# Patient Record
Sex: Male | Born: 1995 | Race: White | Hispanic: No | Marital: Single | Smoking: Never smoker
Health system: Southern US, Community
[De-identification: ages and names within clinical notes are randomized; demographics above are authoritative.]

## PROBLEM LIST (undated history)

## (undated) DIAGNOSIS — I471 Supraventricular tachycardia, unspecified: Secondary | ICD-10-CM

## (undated) DIAGNOSIS — Z8619 Personal history of other infectious and parasitic diseases: Secondary | ICD-10-CM

## (undated) DIAGNOSIS — I456 Pre-excitation syndrome: Secondary | ICD-10-CM

## (undated) DIAGNOSIS — Z9289 Personal history of other medical treatment: Secondary | ICD-10-CM

## (undated) DIAGNOSIS — I499 Cardiac arrhythmia, unspecified: Secondary | ICD-10-CM

## (undated) DIAGNOSIS — G43909 Migraine, unspecified, not intractable, without status migrainosus: Secondary | ICD-10-CM

## (undated) HISTORY — DX: Supraventricular tachycardia: I47.1

## (undated) HISTORY — PX: NO PAST SURGERIES: SHX2092

## (undated) HISTORY — DX: Supraventricular tachycardia, unspecified: I47.10

## (undated) HISTORY — DX: Migraine, unspecified, not intractable, without status migrainosus: G43.909

## (undated) HISTORY — DX: Pre-excitation syndrome: I45.6

## (undated) HISTORY — DX: Cardiac arrhythmia, unspecified: I49.9

## (undated) HISTORY — DX: Personal history of other infectious and parasitic diseases: Z86.19

---
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
## 2016-03-06 ENCOUNTER — Emergency Department (HOSPITAL_COMMUNITY): Payer: BLUE CROSS/BLUE SHIELD

## 2016-03-06 ENCOUNTER — Encounter (HOSPITAL_COMMUNITY): Payer: Self-pay | Admitting: Emergency Medicine

## 2016-03-06 ENCOUNTER — Emergency Department (HOSPITAL_COMMUNITY)
Admission: EM | Admit: 2016-03-06 | Discharge: 2016-03-06 | Disposition: A | Discharge status: Home / Self Care | Payer: BLUE CROSS/BLUE SHIELD | Attending: Emergency Medicine | Admitting: Emergency Medicine

## 2016-03-06 DIAGNOSIS — T675XXA Heat exhaustion, unspecified, initial encounter: Secondary | ICD-10-CM | POA: Diagnosis not present

## 2016-03-06 DIAGNOSIS — R42 Dizziness and giddiness: Secondary | ICD-10-CM | POA: Insufficient documentation

## 2016-03-06 DIAGNOSIS — R55 Syncope and collapse: Secondary | ICD-10-CM | POA: Diagnosis not present

## 2016-03-06 DIAGNOSIS — E860 Dehydration: Secondary | ICD-10-CM

## 2016-03-06 DIAGNOSIS — R110 Nausea: Secondary | ICD-10-CM

## 2016-03-06 DIAGNOSIS — I456 Pre-excitation syndrome: Secondary | ICD-10-CM

## 2016-03-06 DIAGNOSIS — K529 Noninfective gastroenteritis and colitis, unspecified: Secondary | ICD-10-CM

## 2016-03-06 DIAGNOSIS — R1084 Generalized abdominal pain: Secondary | ICD-10-CM | POA: Diagnosis present

## 2016-03-06 DIAGNOSIS — R197 Diarrhea, unspecified: Secondary | ICD-10-CM

## 2016-03-06 LAB — CBC WITH DIFFERENTIAL/PLATELET
BASOS ABS: 0 10*3/uL (ref 0.0–0.1)
BASOS PCT: 0 %
EOS ABS: 0 10*3/uL (ref 0.0–0.7)
EOS PCT: 1 %
HCT: 45.9 % (ref 39.0–52.0)
Hemoglobin: 15.2 g/dL (ref 13.0–17.0)
Lymphocytes Relative: 26 %
Lymphs Abs: 1.6 10*3/uL (ref 0.7–4.0)
MCH: 30.4 pg (ref 26.0–34.0)
MCHC: 33.1 g/dL (ref 30.0–36.0)
MCV: 91.8 fL (ref 78.0–100.0)
MONO ABS: 0.4 10*3/uL (ref 0.1–1.0)
Monocytes Relative: 6 %
NEUTROS ABS: 4.1 10*3/uL (ref 1.7–7.7)
Neutrophils Relative %: 67 %
Platelets: 231 10*3/uL (ref 150–400)
RBC: 5 MIL/uL (ref 4.22–5.81)
RDW: 12.9 % (ref 11.5–15.5)
WBC: 6.2 10*3/uL (ref 4.0–10.5)

## 2016-03-06 LAB — COMPREHENSIVE METABOLIC PANEL
ALBUMIN: 4.7 g/dL (ref 3.5–5.0)
ALK PHOS: 63 U/L (ref 38–126)
ALT: 17 U/L (ref 17–63)
ANION GAP: 6 (ref 5–15)
AST: 18 U/L (ref 15–41)
BILIRUBIN TOTAL: 0.9 mg/dL (ref 0.3–1.2)
BUN: 12 mg/dL (ref 6–20)
CALCIUM: 9.1 mg/dL (ref 8.9–10.3)
CO2: 27 mmol/L (ref 22–32)
Chloride: 104 mmol/L (ref 101–111)
Creatinine, Ser: 0.89 mg/dL (ref 0.61–1.24)
GFR calc non Af Amer: >60 mL/min (ref >60)
GLUCOSE: 95 mg/dL (ref 65–99)
Potassium: 4.1 mmol/L (ref 3.5–5.1)
Sodium: 137 mmol/L (ref 135–145)
TOTAL PROTEIN: 7.3 g/dL (ref 6.5–8.1)

## 2016-03-06 LAB — URINALYSIS, ROUTINE W REFLEX MICROSCOPIC
Bilirubin Urine: NEGATIVE
GLUCOSE, UA: NEGATIVE mg/dL
HGB URINE DIPSTICK: NEGATIVE
KETONES UR: NEGATIVE mg/dL
LEUKOCYTES UA: NEGATIVE
Nitrite: NEGATIVE
PROTEIN: NEGATIVE mg/dL
Specific Gravity, Urine: 1.009 (ref 1.005–1.030)
pH: 7.5 (ref 5.0–8.0)

## 2016-03-06 LAB — CK: Total CK: 132 U/L (ref 49–397)

## 2016-03-06 LAB — I-STAT TROPONIN, ED: Troponin i, poc: 0 ng/mL (ref 0.00–0.08)

## 2016-03-06 LAB — LIPASE, BLOOD: Lipase: 24 U/L (ref 11–51)

## 2016-03-06 LAB — TSH: TSH: 1.041 u[IU]/mL (ref 0.350–4.500)

## 2016-03-06 MED ORDER — SODIUM CHLORIDE 0.9 % IV BOLUS (SEPSIS)
1000.0000 mL | Freq: Once | INTRAVENOUS | Status: AC
  Administered 2016-03-06: 1000 mL via INTRAVENOUS

## 2016-03-06 MED ORDER — ONDANSETRON 4 MG PO TBDP
4.0000 mg | ORAL_TABLET | Freq: Three times a day (TID) | ORAL | Status: DC | PRN
Start: 2016-03-06 — End: 2016-12-02

## 2016-03-06 NOTE — ED Notes (Signed)
PA at bedside.

## 2016-03-06 NOTE — ED Notes (Signed)
Pt given discharge instructions verbalized understanding of need to follow up with PCP and cardiologist, reasons to return to the ED and medications to take at home. Pt denied pain, VSS, IV removed intact, site clean and dry. Pt denied further questions or concerns. Pt able to change clothes and ambulate to exit without difficulty.

## 2016-03-06 NOTE — ED Notes (Signed)
Pt made aware of need for urine sample, urinal and call light at bedside. 

## 2016-03-06 NOTE — ED Notes (Signed)
Per patient, he became weak on Thursday night with abdominal pain associated with headaches.  Patient states he works outside.  He states he has nausea and diarrhea.  Denies any fever and chilis.

## 2016-03-06 NOTE — ED Notes (Signed)
Pt transported to xray 

## 2016-03-06 NOTE — Discharge Instructions (Signed)
Stay very well hydrated with plenty of water/gatorade. Avoid being outside for prolonged periods of time during the peak heat throughout the day, and always make sure you're hydrated when outside. Get plenty of rest. Use zofran as prescribed, as needed for nausea. Use tylenol or motrin as needed for pain. Follow a BRAT (banana-rice-applesauce-toast) diet as described below for the next 24-48 hours. The 'BRAT' diet is suggested, then progress to diet as tolerated as symptoms abate. Call your regular doctor if bloody stools, persistent diarrhea, vomiting, fever or abdominal pain.  Follow up with your primary care doctor and your cardiologist in 5-7 days for recheck of symptoms and ongoing management of your conditions. Return to ER for changing or worsening of symptoms.  Food Choices to Help Relieve Diarrhea When you have diarrhea, the foods you eat and your eating habits are very important. Choosing the right foods and drinks can help relieve diarrhea. Also, because diarrhea can last up to 7 days, you need to replace lost fluids and electrolytes (such as sodium, potassium, and chloride) in order to help prevent dehydration.  WHAT GENERAL GUIDELINES DO I NEED TO FOLLOW?  Slowly drink 1 cup (8 oz) of fluid for each episode of diarrhea. If you are getting enough fluid, your urine will be clear or pale yellow.  Eat starchy foods. Some good choices include white rice, white toast, pasta, low-fiber cereal, baked potatoes (without the skin), saltine crackers, and bagels.  Avoid large servings of any cooked vegetables.  Limit fruit to two servings per day. A serving is  cup or 1 small piece.  Choose foods with less than 2 g of fiber per serving.  Limit fats to less than 8 tsp (38 g) per day.  Avoid fried foods.  Eat foods that have probiotics in them. Probiotics can be found in certain dairy products.  Avoid foods and beverages that may increase the speed at which food moves through the stomach and  intestines (gastrointestinal tract). Things to avoid include:  High-fiber foods, such as dried fruit, raw fruits and vegetables, nuts, seeds, and whole grain foods.  Spicy foods and high-fat foods.  Foods and beverages sweetened with high-fructose corn syrup, honey, or sugar alcohols such as xylitol, sorbitol, and mannitol. WHAT FOODS ARE RECOMMENDED? Grains White rice. White, [place], or pita breads (fresh or toasted), including plain rolls, buns, or bagels. White pasta. Saltine, soda, or graham crackers. Pretzels. Low-fiber cereal. Cooked cereals made with water (such as cornmeal, farina, or cream cereals). Plain muffins. Matzo. Melba toast. Zwieback.  Vegetables Potatoes (without the skin). Strained tomato and vegetable juices. Most well-cooked and canned vegetables without seeds. Tender lettuce. Fruits Cooked or canned applesauce, apricots, cherries, fruit cocktail, grapefruit, peaches, pears, or plums. Fresh bananas, apples without skin, cherries, grapes, cantaloupe, grapefruit, peaches, oranges, or plums.  Meat and Other Protein Products Baked or boiled chicken. Eggs. Tofu. Fish. Seafood. Smooth peanut butter. Ground or well-cooked tender beef, ham, veal, lamb, pork, or poultry.  Dairy Plain yogurt, kefir, and unsweetened liquid yogurt. Lactose-free milk, buttermilk, or soy milk. Plain hard cheese. Beverages Sport drinks. Clear broths. Diluted fruit juices (except prune). Regular, caffeine-free sodas such as ginger ale. Water. Decaffeinated teas. Oral rehydration solutions. Sugar-free beverages not sweetened with sugar alcohols. Other Bouillon, broth, or soups made from recommended foods.  The items listed above may not be a complete list of recommended foods or beverages. Contact your dietitian for more options. WHAT FOODS ARE NOT RECOMMENDED? Grains Whole grain, whole wheat, bran, or rye  breads, rolls, pastas, crackers, and cereals. Wild or brown rice. Cereals that contain more than 2  g of fiber per serving. Corn tortillas or taco shells. Cooked or dry oatmeal. Granola. Popcorn. Vegetables Raw vegetables. Cabbage, broccoli, Brussels sprouts, artichokes, baked beans, beet greens, corn, kale, legumes, peas, sweet potatoes, and yams. Potato skins. Cooked spinach and cabbage. Fruits Dried fruit, including raisins and dates. Raw fruits. Stewed or dried prunes. Fresh apples with skin, apricots, mangoes, pears, raspberries, and strawberries.  Meat and Other Protein Products Chunky peanut butter. Nuts and seeds. Beans and lentils. Tomasa Blase.  Dairy High-fat cheeses. Milk, chocolate milk, and beverages made with milk, such as milk shakes. Cream. Ice cream. Sweets and Desserts Sweet rolls, doughnuts, and sweet breads. Pancakes and waffles. Fats and Oils Butter. Cream sauces. Margarine. Salad oils. Plain salad dressings. Olives. Avocados.  Beverages Caffeinated beverages (such as coffee, tea, soda, or energy drinks). Alcoholic beverages. Fruit juices with pulp. Prune juice. Soft drinks sweetened with high-fructose corn syrup or sugar alcohols. Other Coconut. Hot sauce. Chili powder. Mayonnaise. Gravy. Cream-based or milk-based soups.  The items listed above may not be a complete list of foods and beverages to avoid. Contact your dietitian for more information. WHAT SHOULD I DO IF I BECOME DEHYDRATED? Diarrhea can sometimes lead to dehydration. Signs of dehydration include dark urine and dry mouth and skin. If you think you are dehydrated, you should rehydrate with an oral rehydration solution. These solutions can be purchased at pharmacies, retail stores, or online.  Drink -1 cup (120-240 mL) of oral rehydration solution each time you have an episode of diarrhea. If drinking this amount makes your diarrhea worse, try drinking smaller amounts more often. For example, drink 1-3 tsp (5-15 mL) every 5-10 minutes.  A general rule for staying hydrated is to drink 1-2 L of fluid per day. Talk to  your health care provider about the specific amount you should be drinking each day. Drink enough fluids to keep your urine clear or pale yellow. Document Released: 10/23/2003 Document Revised: 08/07/2013 Document Reviewed: 06/25/2013 [facility] Patient Information 2015 [place], [place]. This information is not intended to replace advice given to you by your health care provider. Make sure you discuss any questions you have with your health care provider.   Abdominal Pain, Adult Many things can cause belly (abdominal) pain. Most times, the belly pain is not dangerous. Many cases of belly pain can be watched and treated at home. HOME CARE   Do not take medicines that help you go poop (laxatives) unless told to by your doctor.  Only take medicine as told by your doctor.  Eat or drink as told by your doctor. Your doctor will tell you if you should be on a special diet. GET HELP IF:  You do not know what is causing your belly pain.  You have belly pain while you are sick to your stomach (nauseous) or have runny poop (diarrhea).  You have pain while you pee or poop.  Your belly pain wakes you up at night.  You have belly pain that gets worse or better when you eat.  You have belly pain that gets worse when you eat fatty foods.  You have a fever. GET HELP RIGHT AWAY IF:   The pain does not go away within 2 hours.  You keep throwing up (vomiting).  The pain changes and is only in the right or left part of the belly.  You have bloody or tarry looking poop. MAKE SURE  YOU:   Understand these instructions.  Will watch your condition.  Will get help right away if you are not doing well or get worse.   This information is not intended to replace advice given to you by your health care provider. Make sure you discuss any questions you have with your health care provider.   Document Released: 01/19/2008 Document Revised: 08/23/2014 Document Reviewed: 04/11/2013 Elsevier Interactive  Patient Education 2016 Else[employer] Inc.  Dehydration, Adult Dehydration is a condition in which you do not have enough fluid or water in your body. It happens when you take in less fluid than you lose. Vital organs such as the kidneys, brain, and heart cannot function without a proper amount of fluids. Any loss of fluids from the body can cause dehydration.  Dehydration can range from mild to severe. This condition should be treated right away to help prevent it from becoming severe. CAUSES  This condition may be caused by:  Vomiting.  Diarrhea.  Excessive sweating, such as when exercising in hot or humid weather.  Not drinking enough fluid during strenuous exercise or during an illness.  Excessive urine output.  Fever.  Certain medicines. RISK FACTORS This condition is more likely to develop in:  People who are taking certain medicines that cause the body to lose excess fluid (diuretics).   People who have a chronic illness, such as diabetes, that may increase urination.  Older adults.   People who live at high altitudes.   People who participate in endurance sports.  SYMPTOMS  Mild Dehydration  Thirst.  Dry lips.  Slightly dry mouth.  Dry, warm skin. Moderate Dehydration  Very dry mouth.   Muscle cramps.   Dark urine and decreased urine production.   Decreased tear production.   Headache.   Light-headedness, especially when you stand up from a sitting position.  Severe Dehydration  Changes in skin.   Cold and clammy skin.   Skin does not spring back quickly when lightly pinched and released.   Changes in body fluids.   Extreme thirst.   No tears.   Not able to sweat when body temperature is high, such as in hot weather.   Minimal urine production.   Changes in vital signs.   Rapid, weak pulse (more than 100 beats per minute when you are sitting still).   Rapid breathing.   Low blood pressure.   Other changes.    Sunken eyes.   Cold hands and feet.   Confusion.  Lethargy and difficulty being awakened.  Fainting (syncope).   Short-term weight loss.   Unconsciousness. DIAGNOSIS  This condition may be diagnosed based on your symptoms. You may also have tests to determine how severe your dehydration is. These tests may include:   Urine tests.   Blood tests.  TREATMENT  Treatment for this condition depends on the severity. Mild or moderate dehydration can often be treated at home. Treatment should be started right away. Do not wait until dehydration becomes severe. Severe dehydration needs to be treated at the hospital. Treatment for Mild Dehydration  Drinking plenty of water to replace the fluid you have lost.   Replacing minerals in your blood (electrolytes) that you may have lost.  Treatment for Moderate Dehydration  Consuming oral rehydration solution (ORS). Treatment for Severe Dehydration  Receiving fluid through an IV tube.   Receiving electrolyte solution through a feeding tube that is passed through your nose and into your stomach (nasogastric tube or NG tube).  Correcting any  abnormalities in electrolytes. HOME CARE INSTRUCTIONS   Drink enough fluid to keep your urine clear or pale yellow.   Drink water or fluid slowly by taking small sips. You can also try sucking on ice cubes.  Have food or beverages that contain electrolytes. Examples include bananas and sports drinks.  Take over-the-counter and prescription medicines only as told by your health care provider.   Prepare ORS according to the manufacturer's instructions. Take sips of ORS every 5 minutes until your urine returns to normal.  If you have vomiting or diarrhea, continue to try to drink water, ORS, or both.   If you have diarrhea, avoid:   Beverages that contain caffeine.   Fruit juice.   Milk.   Carbonated soft drinks.  Do not take salt tablets. This can lead to the  condition of having too much sodium in your body (hypernatremia).  SEEK MEDICAL CARE IF:  You cannot eat or drink without vomiting.  You have had moderate diarrhea during a period of more than 24 hours.  You have a fever. SEEK IMMEDIATE MEDICAL CARE IF:   You have extreme thirst.  You have severe diarrhea.  You have not urinated in 6-8 hours, or you have urinated only a small amount of very dark urine.  You have shriveled skin.  You are dizzy, confused, or both.   This information is not intended to replace advice given to you by your health care provider. Make sure you discuss any questions you have with your health care provider.   Document Released: 08/02/2005 Document Revised: 04/23/2015 Document Reviewed: 12/18/2014 Elsevier Interactive Patient Education 2016 Elsevier Inc.  Diarrhea Diarrhea is watery poop (stool). It can make you feel weak, tired, thirsty, or give you a dry mouth (signs of dehydration). Watery poop is a sign of another problem, most often an infection. It often lasts 2-3 days. It can last longer if it is a sign of something serious. Take care of yourself as told by your doctor. HOME CARE   Drink 1 cup (8 ounces) of fluid each time you have watery poop.  Do not drink the following fluids:  Those that contain simple sugars (fructose, glucose, galactose, lactose, sucrose, maltose).  Sports drinks.  Fruit juices.  Whole milk products.  Sodas.  Drinks with caffeine (coffee, tea, soda) or alcohol.  Oral rehydration solution may be used if the doctor says it is okay. You may make your own solution. Follow this recipe:   - teaspoon table salt.   teaspoon baking soda.   teaspoon salt substitute containing potassium chloride.  1 tablespoons sugar.  1 liter (34 ounces) of water.  Avoid the following foods:  High fiber foods, such as raw fruits and vegetables.  Nuts, seeds, and whole grain breads and cereals.   Those that are sweetened with  sugar alcohols (xylitol, sorbitol, mannitol).  Try eating the following foods:  Starchy foods, such as rice, toast, pasta, low-sugar cereal, oatmeal, baked potatoes, crackers, and bagels.  Bananas.  Applesauce.  Eat probiotic-rich foods, such as yogurt and milk products that are fermented.  Wash your hands well after each time you have watery poop.  Only take medicine as told by your doctor.  Take a warm bath to help lessen burning or pain from having watery poop. GET HELP RIGHT AWAY IF:   You cannot drink fluids without throwing up (vomiting).  You keep throwing up.  You have blood in your poop, or your poop looks black and tarry.  You  do not pee (urinate) in 6-8 hours, or there is only a small amount of very dark pee.  You have belly (abdominal) pain that gets worse or stays in the same spot (localizes).  You are weak, dizzy, confused, or light-headed.  You have a very bad headache.  Your watery poop gets worse or does not get better.  You have a fever or lasting symptoms for more than 2-3 days.  You have a fever and your symptoms suddenly get worse. MAKE SURE YOU:   Understand these instructions.  Will watch your condition.  Will get help right away if you are not doing well or get worse.   This information is not intended to replace advice given to you by your health care provider. Make sure you discuss any questions you have with your health care provider.   Document Released: 01/19/2008 Document Revised: 08/23/2014 Document Reviewed: 04/09/2012 Elsevier Interactive Patient Education 2016 [employer].  [employer] Exhaustion Information WHAT IS HEAT EXHAUSTION? Heat exhaustion happens when your body gets overheated from hot weather or from exercise. Heat exhaustion makes the temperature of your skin and body go up. Your body cools itself by sweating. If you do not drink enough water to replace what you sweat, you lose too much water and salt. This makes it harder  for your body to produce more sweat. When you do not sweat enough, your body cannot cool down, and heat exhaustion may result. Heat exhaustion can lead to heatstroke, which is a more serious illness. WHO IS AT RISK FOR HEAT EXHAUSTION? Anyone can get heat exhaustion. However, heat exhaustion is more likely when you are exercising or doing a physical activity. It is also more likely when you are in hot and humid weather or bright sunshine. Heat exhaustion is also more likely to develop in:  People who are age 63 or older.  Children.  People who have a medical condition such as heart disease, poor circulation, sickle cell disease, or high blood pressure.  People who have a fever.  People who are very overweight (obese).  People who are dehydrated from:  Drinking alcohol or caffeine.  Taking certain medicines, such as diuretics or stimulants. WHAT ARE THE SYMPTOMS OF HEAT EXHAUSTION? Symptoms of heat exhaustion include:  A body temperature of up to 104F (40C).  Moist, cool, and clammy skin.  Dizziness.  Headache.  Nausea.  Fatigue.  Thirst.  Dark-colored urine.  Rapid pulse or heartbeat.  Weakness.  Muscle cramps.  Confusion.  Fainting. WHAT SHOULD I DO IF I THINK I HAVE HEAT EXHAUSTION? If you think that you have heat exhaustion, call your health care provider. Follow his or her instructions. You should also:  Call a friend or a family member and ask someone to stay with you.  Move to a cooler location, such as:  Into the shade.  In front of a fan.  Someplace that has air conditioning.  Lie down and rest.  Slowly drink nonalcoholic, caffeine-free fluids.  Take off any extra clothing or tight-fitting clothes.  Take a cool bath or shower, if possible. If you do not have access to a bath or shower, dab or mist cool water on your skin. WHY IS IT IMPORTANT TO TREAT HEAT EXHAUSTION? It is extremely important to take care of yourself and treat heat  exhaustion as soon as possible. Untreated heat exhaustion can turn into heatstroke. Symptoms of heatstroke include:  A body temperature of 104F (40C) or higher.  Hot, red skin that may  be dry or moist.  Severe headache.  Nausea and vomiting.  Muscle weakness and cramping.  Confusion.  Rapid breathing.  Fainting.  Seizure. These symptoms may represent a serious problem that is an emergency. Do not wait to see if the symptoms will go away. Get medical help right away. Call your local emergency services (911 in the [place] Do not drive yourself to the hospital. Heatstroke is a life-threatening condition that requires urgent medical treatment. Do not treat heatstroke at home. Heatstroke should be treated by a health care professional and may require hospitalization. At the hospital, you may need to receive fluids through an IV tube:  If you cannot drink any fluids.  If you vomit any fluids that you drink.  If your symptoms do not get better after one hour.  If your symptoms get worse after one hour. HOW CAN I PREVENT HEAT EXHAUSTION?  Avoid outdoor activities on very hot or humid days.  Do not exercise or do other physical activity when you are not feeling well.  Drink plenty of nonalcoholic and caffeine-free fluids before and during physical activity.  Take frequent breaks for rest during physical activity.  Wear light-colored, loose-fitting, and lightweight clothing in the heat.  Wear a hat and use sunscreen when exercising outdoors.  Avoid being outside during the hottest times of the day.  Check with your health care provider before you start any new activity, especially if you take medicine or have a medical condition.  Start any new activity slowly and work up to your fitness level. HOW CAN I HELP TO PROTECT ELDERLY RELATIVES AND NEIGHBORS FROM HEAT EXHAUSTION? People who are age 43 or older are at greater risk for heat exhaustion. Their bodies have a harder time  adjusting to heat. They are also more likely to have a medical condition or be on medicines that increase their risk for heat exhaustion. They may get heat exhaustion indoors if the heat is high for several days. You can help to protect them during hot weather by:  Checking on them two or more times each day.  Making sure that they are drinking plenty of cool, nonalcoholic, and caffeine-free fluids.  Making sure that they use their air conditioner.  Taking them to a location where air conditioning is available.  Talking with their health care provider about their medical needs, medicines, and fluid requirements.   This information is not intended to replace advice given to you by your health care provider. Make sure you discuss any questions you have with your health care provider.   Document Released: 05/11/2008 Document Revised: 04/23/2015 Document Reviewed: 07/10/2014 Elsevier Interactive Patient Education 2016 Elsevier Inc.  Nausea, Adult Nausea is the feeling that you have an upset stomach or have to vomit. Nausea by itself is not likely a serious concern, but it may be an early sign of more serious medical problems. As nausea gets worse, it can lead to vomiting. If vomiting develops, there is the risk of dehydration.  CAUSES   Viral infections.  Food poisoning.  Medicines.  Pregnancy.  Motion sickness.  Migraine headaches.  Emotional distress.  Severe pain from any source.  Alcohol intoxication. HOME CARE INSTRUCTIONS  Get plenty of rest.  Ask your caregiver about specific rehydration instructions.  Eat small amounts of food and sip liquids more often.  Take all medicines as told by your caregiver. SEEK MEDICAL CARE IF:  You have not improved after 2 days, or you get worse.  You have a headache.  SEEK IMMEDIATE MEDICAL CARE IF:   You have a fever.  You faint.  You keep vomiting or have blood in your vomit.  You are extremely weak or dehydrated.  You  have dark or bloody stools.  You have severe chest or abdominal pain. MAKE SURE YOU:  Understand these instructions.  Will watch your condition.  Will get help right away if you are not doing well or get worse.   This information is not intended to replace advice given to you by your health care provider. Make sure you discuss any questions you have with your health care provider.   Document Released: 09/09/2004 Document Revised: 08/23/2014 Document Reviewed: 04/14/2011 Elsevier Interactive Patient Education 2016 [employer].  Near-Syncope Near-syncope (commonly known as near fainting) is sudden weakness, dizziness, or feeling like you might pass out. During an episode of near-syncope, you may also develop pale skin, have tunnel vision, or feel sick to your stomach (nauseous). Near-syncope may occur when getting up after sitting or while standing for a long time. It is caused by a sudden decrease in blood flow to the brain. This decrease can result from various causes or triggers, most of which are not serious. However, because near-syncope can sometimes be a sign of something serious, a medical evaluation is required. The specific cause is often not determined. HOME CARE INSTRUCTIONS  Monitor your condition for any changes. The following actions may help to alleviate any discomfort you are experiencing:  Have someone stay with you until you feel stable.  Lie down right away and prop your feet up if you start feeling like you might faint. Breathe deeply and steadily. Wait until all the symptoms have passed. Most of these episodes last only a few minutes. You may feel tired for several hours.   Drink enough fluids to keep your urine clear or pale yellow.   If you are taking blood pressure or heart medicine, get up slowly when seated or lying down. Take several minutes to sit and then stand. This can reduce dizziness.  Follow up with your health care provider as directed. SEEK  IMMEDIATE MEDICAL CARE IF:   You have a severe headache.   You have unusual pain in the chest, abdomen, or back.   You are bleeding from the mouth or rectum, or you have black or tarry stool.   You have an irregular or very fast heartbeat.   You have repeated fainting or have seizure-like jerking during an episode.   You faint when sitting or lying down.   You have confusion.   You have difficulty walking.   You have severe weakness.   You have vision problems.  MAKE SURE YOU:   Understand these instructions.  Will watch your condition.  Will get help right away if you are not doing well or get worse.   This information is not intended to replace advice given to you by your health care provider. Make sure you discuss any questions you have with your health care provider.   Document Released: 08/02/2005 Document Revised: 08/07/2013 Document Reviewed: 01/05/2013 Elsevier Interactive Patient Education 2016 Elsevier Inc.  Rehydration, Adult Rehydration is the replacement of body fluids lost during dehydration. Dehydration is an extreme loss of body fluids to the point of body function impairment. There are many ways extreme fluid loss can occur, including vomiting, diarrhea, or excess sweating. Recovering from dehydration requires replacing lost fluids, continuing to eat to maintain strength, and avoiding foods and beverages that may contribute to  further fluid loss or may increase nausea. HOW TO REHYDRATE In most cases, rehydration involves the replacement of not only fluids but also carbohydrates and basic body salts. Rehydration with an oral rehydration solution is one way to replace essential nutrients lost through dehydration. An oral rehydration solution can be purchased at pharmacies, retail stores, and online. Premixed packets of powder that you combine with water to make a solution are also sold. You can prepare an oral rehydration solution at home by mixing the  following ingredients together:    - tsp table salt.   tsp baking soda.   tsp salt substitute containing potassium chloride.  1 tablespoons sugar.  1 L (34 oz) of water. Be sure to use exact measurements. Including too much sugar can make diarrhea worse. Drink -1 cup (120-240 mL) of oral rehydration solution each time you have diarrhea or vomit. If drinking this amount makes your vomiting worse, try drinking smaller amounts more often. For example, drink 1-3 tsp every 5-10 minutes.  A general rule for staying hydrated is to drink 1-2 L of fluid per day. Talk to your caregiver about the specific amount you should be drinking each day. Drink enough fluids to keep your urine clear or pale yellow. EATING WHEN DEHYDRATED Even if you have had severe sweating or you are having diarrhea, do not stop eating. Many healthy items in a normal diet are okay to continue eating while recovering from dehydration. The following tips can help you to lessen nausea when you eat:  Ask someone else to prepare your food. Cooking smells may worsen nausea.  Eat in a well-ventilated room away from cooking smells.  Sit up when you eat. Avoid lying down until 1-2 hours after eating.  Eat small amounts when you eat.  Eat foods that are easy to digest. These include soft, well-cooked, or mashed foods. FOODS AND BEVERAGES TO AVOID Avoid eating or drinking the following foods and beverages that may increase nausea or further loss of fluid:   Fruit juices with a high sugar content, such as concentrated juices.  Alcohol.  Beverages containing caffeine.  Carbonated drinks. They may cause a lot of gas.  Foods that may cause a lot of gas, such as cabbage, broccoli, and beans.  Fatty, greasy, and fried foods.  Spicy, very salty, and very sweet foods or drinks.  Foods or drinks that are very hot or very cold. Consume food or drinks at or near room temperature.  Foods that need a lot of chewing, such as raw  vegetables.  Foods that are sticky or hard to swallow, such as peanut butter.   This information is not intended to replace advice given to you by your health care provider. Make sure you discuss any questions you have with your health care provider.   Document Released: 10/25/2011 Document Revised: 04/26/2012 Document Reviewed: 10/25/2011 Elsevier Interactive Patient Education 2016 [employer].  Wolff-Parkinson-White Syndrome Wolff-Parkinson-White Syndrome (WPW) is an abnormal heart condition that can cause the heart to beat very fast. CAUSES  In WPW syndrome, an extra electrical connection (pathway) exists between the top chambers of your heart (atria) and the bottom chambers of your heart (ventricles). This is known as an extra (accessory) pathway. This extra pathway can cause the heart to short circuit and beat very fast. SYMPTOMS  Symptoms in WPW syndrome can vary. These include:  Feeling your heart "skip" beats (palpitations).  Dizziness.  Fainting or near fainting.  Sudden death. DIAGNOSIS  WPW can be diagnosed by  different test such as:  ECG (electrocardiogram).  Echocardiogram.  Holter monitoring.  Stress testing.  Electrophysiology study.  Laboratory tests that check certain blood levels.  Thyroid study. TREATMENT  WPW is usually treated in two ways:  Radiofrequency destruction (ablation). In this procedure, a thin, flexible tube (catheter) is placed in the heart through a vein in the upper leg (groin). The catheter is guided to the extra pathway. The extra pathway is destroyed by using high frequency radio waves.  Medicine can sometimes be used to treat WPW. SEEK IMMEDIATE MEDICAL CARE IF:   You feel palpitations that are frequent or continual.  You develop chest pain and also have:  Shortness of breath or difficulty breathing.  Nausea and vomiting.  Sweating.  You become light-headed or faint (pass out). MAKE SURE YOU:   Understand these  instructions.  Will watch your condition.  Will get help right away if you are not doing well or get worse.   This information is not intended to replace advice given to you by your health care provider. Make sure you discuss any questions you have with your health care provider.   Document Released: 10/23/2003 Document Revised: 10/25/2011 Document Reviewed: 02/19/2015 Elsevier Interactive Patient Education [employer]

## 2016-03-06 NOTE — ED Notes (Signed)
PA at bedside unable to collect labs at this time

## 2016-03-06 NOTE — ED Provider Notes (Signed)
CSN: [id number]     Arrival date & time 03/06/16  1251 History   First MD Initiated Contact with Patient 03/06/16 1319     Chief Complaint  Patient presents with  . Abdominal Pain  . Diarrhea     (Consider location/radiation/quality/duration/timing/severity/associated sxs/prior Treatment) HPI Comments: Paul Bean is a 20 y.o. male with a PMHx of WPW and PSVT (followed by Dr. Ladona Ridgel of [facility]), who presents to the ED with multiple complaints. Her primary complaint is 2 days of feeling lightheadedness when he exerts himself or stands up, mostly describes it as a near syncopal feeling but occasionally has some vertigo component describing a spinning sensation. This resolves completely when he rests, and he felt better yesterday because he was resting most of the day, but every time he gets up he has recurrence of symptoms. 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 His mother is with him and is worried that either his thyroid is off, or he has heat exhaustion, or that his WPW is causing issues (and she is unhappy that the pt won't agree to getting the ablation that he was recommended to get). Additionally he reports that yesterday he developed 5/10 aching nonradiating generalized abdominal pain which is intermittent worse with eating and improved with Tylenol, associated with nausea and 2 episodes of nonbloody watery diarrhea. He also reports he has had intermittent headaches which feel similar to prior headaches and is currently resolved; his mother thinks he has migraines because she has migraines, but also wonders if the pt could just be dehydrated and getting headaches from that. He states that sometimes he gets short of breath when he exerts himself, mostly when he feels lightheaded, but this is not a persistent symptom and he has no ongoing shortness of breath at this time. +Sick contacts at work with similar GI symptoms.   He denies any  diaphoresis, syncope, vision changes, fevers, chills, chest pain, ongoing shortness of breath, leg swelling, recent travel/surgery/immobilization, family or personal history of DVT/PE, vomiting, hematochezia, melena, constipation, obstipation, dysuria, hematuria, claudication, orthopnea, numbness, tingling, or focal weakness. He denies suspicious food intake, alcohol use, or chronic NSAID use. No prior abdominal surgeries.  Patient is a 20 y.o. male presenting with abdominal pain, diarrhea, and near-syncope. The history is provided by the patient and medical records. No language interpreter was used.  Abdominal Pain Pain location:  Generalized Pain quality: aching   Pain radiates to:  Does not radiate Pain severity:  Mild Onset quality:  Gradual Duration:  1 day Timing:  Intermittent Progression:  Unchanged Chronicity:  New Context: sick contacts   Context: not recent travel and not suspicious food intake   Relieved by:  Acetaminophen Worsened by:  Eating Ineffective treatments:  None tried Associated symptoms: diarrhea and nausea   Associated symptoms: no chest pain, no chills, no constipation, no dysuria, no fever, no flatus, no hematemesis, no hematochezia, no hematuria, no melena, no shortness of breath (occasionally with exertion, none ongoing) and no vomiting   Risk factors: no alcohol abuse, has not had multiple surgeries and no NSAID use   Diarrhea Associated symptoms: abdominal pain and headaches (intermittent, currently resolved)   Associated symptoms: no arthralgias, no chills, no diaphoresis, no fever, no myalgias and no vomiting   Near Syncope This is a recurrent problem. The current episode started in the past 7 days. The problem occurs daily. The problem has been unchanged. Associated symptoms include  abdominal pain, headaches (intermittent, currently resolved), nausea and vertigo (occasionally describes spinning sensation, occasionally describes just lightheadedness).  Pertinent negatives include no arthralgias, chest pain, chills, diaphoresis, fever, myalgias, numbness, visual change, vomiting or weakness. The symptoms are aggravated by standing and exertion. He has tried rest for the symptoms. The treatment provided significant relief.    Past Medical History  Diagnosis Date  . Arrhythmia   . PSVT (paroxysmal supraventricular tachycardia) (HCC)   . WPW (Wolff-Parkinson-White syndrome)    History reviewed. No pertinent past surgical history. No family history on file. Social History  Substance Use Topics  . Smoking status: Never Smoker   . Smokeless tobacco: None  . Alcohol Use: No    Review of Systems  Constitutional: Negative for fever, chills and diaphoresis.  Eyes: Negative for visual disturbance.  Respiratory: Negative for shortness of breath (occasionally with exertion, none ongoing).   Cardiovascular: Positive for near-syncope. Negative for chest pain and leg swelling.  Gastrointestinal: Positive for nausea, abdominal pain and diarrhea. Negative for vomiting, constipation, blood in stool, melena, hematochezia, flatus and hematemesis.  Genitourinary: Negative for dysuria and hematuria.  Musculoskeletal: Negative for myalgias and arthralgias.  Skin: Negative for color change.  Allergic/Immunologic: Negative for immunocompromised state.  Neurological: Positive for vertigo (occasionally describes spinning sensation, occasionally describes just lightheadedness), light-headedness and headaches (intermittent, currently resolved). Negative for weakness and numbness.  Psychiatric/Behavioral: Negative for confusion.   10 Systems reviewed and are negative for acute change except as noted in the HPI.    Allergies  Penicillins  Home Medications   Prior to Admission medications   Not on File   BP 122/79 mmHg  Pulse 75  Temp(Src) 98.1 F (36.7 C) (Oral)  Resp 18  Ht 6\' 2"  (1.88 m)  Wt 70.308 kg  BMI 19.89 kg/m2  SpO2 100% Physical Exam    Constitutional: He is oriented to person, place, and time. Vital signs are normal. He appears well-developed and well-nourished.  Non-toxic appearance. No distress.  Afebrile, nontoxic, NAD  HENT:  Head: Normocephalic and atraumatic.  Mouth/Throat: Oropharynx is clear and moist and mucous membranes are normal.  Eyes: Conjunctivae and EOM are normal. Pupils are equal, round, and reactive to light. Right eye exhibits no discharge. Left eye exhibits no discharge.  PERRL, EOMI, no nystagmus, no visual field deficits   Neck: Normal range of motion. Neck supple. No spinous process tenderness and no muscular tenderness present. No rigidity. Normal range of motion present.  FROM intact without spinous process TTP, no bony stepoffs or deformities, no paraspinous muscle TTP or muscle spasms. No rigidity or meningeal signs. No bruising or swelling.   Cardiovascular: Normal rate, regular rhythm, normal heart sounds and intact distal pulses.  Exam reveals no gallop and no friction rub.   No murmur heard. RRR, nl s1/s2, no m/r/g, distal pulses intact, no pedal edema   Pulmonary/Chest: Effort normal and breath sounds normal. No respiratory distress. He has no decreased breath sounds. He has no wheezes. He has no rhonchi. He has no rales.  CTAB in all lung fields, no w/r/r, no hypoxia or increased WOB, speaking in full sentences, SpO2 100% on RA   Abdominal: Soft. Normal appearance and bowel sounds are normal. He exhibits no distension. There is tenderness in the suprapubic area and left lower quadrant. There is no rigidity, no rebound, no guarding, no CVA tenderness, no tenderness at McBurney's point and negative Murphy's sign.    Soft, nondistended, +BS throughout, with very mild discomfort/soreness on palpation of  the lower abdomen, no r/g/r, neg murphy's, neg mcburney's, no CVA TTP   Musculoskeletal: Normal range of motion.  MAE x4 Strength and sensation grossly intact Distal pulses intact Gait steady   Neurological: He is alert and oriented to person, place, and time. He has normal strength. No cranial nerve deficit or sensory deficit. He displays a negative Romberg sign. Coordination and gait normal. GCS eye subscore is 4. GCS verbal subscore is 5. GCS motor subscore is 6.  CN 2-12 grossly intact A&O x4 GCS 15 Sensation and strength intact Gait nonataxic including with tandem walking Coordination with finger-to-nose WNL Neg pronator drift, neg romberg  Skin: Skin is warm, dry and intact. No rash noted.  Psychiatric: He has a normal mood and affect.  Nursing note and vitals reviewed.   ED Course  Procedures (including critical care time)  14:21 Orthostatic Vital Signs RK  Orthostatic Lying  - BP- Lying: 114/60 mmHg ; Pulse- Lying: 62  Orthostatic Sitting - BP- Sitting: 108/72 mmHg ; Pulse- Sitting: 64  Orthostatic Standing at 0 minutes - BP- Standing at 0 minutes: 110/77 mmHg ; Pulse- Standing at 0 minutes: 81      Labs Review Labs Reviewed  LIPASE, BLOOD  COMPREHENSIVE METABOLIC PANEL  URINALYSIS, ROUTINE W REFLEX MICROSCOPIC (NOT AT [facility])  CBC WITH DIFFERENTIAL/PLATELET  CK  TSH  I-STAT TROPOININ, ED    Imaging Review Dg Chest 2 View  03/06/2016  CLINICAL DATA:  Weakness and abdominal pain. EXAM: CHEST  2 VIEW COMPARISON:  None. FINDINGS: The heart size and mediastinal contours are within normal limits. There is no evidence of pulmonary edema, consolidation, pneumothorax, nodule or pleural fluid. The visualized skeletal structures are unremarkable. IMPRESSION: No active cardiopulmonary disease. Electronically Signed   By: Irish Lack M.D.   On: 03/06/2016 14:14   I have personally reviewed and evaluated these images and lab results as part of my medical decision-making.   EKG Interpretation   Date/Time:  Saturday March 06 2016 13:12:57 EDT Ventricular Rate:  69 PR Interval:    QRS Duration: 149 QT Interval:  381 QTC Calculation: 409 R Axis:   -4 Text  Interpretation:  Sinus or ectopic atrial rhythm Ventricular  preexcitation(WPW) No old tracing to compare Confirmed by [facility]  MD,  ELLIOTT [phone]) on 03/06/2016 1:25:03 PM      MDM   Final diagnoses:  Orthostatic lightheadedness  Generalized abdominal pain  Nausea  Diarrhea, unspecified type  Gastroenteritis  Heat exhaustion, initial encounter  Dehydration  WPW (Wolff-Parkinson-White syndrome)    20 y.o. male here with multiple medical complaints, primarily a weakness feeling and lightheadedness with standing which he occasionally describes as vertiginous but mostly just a lightheaded feeling, since 2 days ago; also 1 day of abd pain/nausea/diarrhea; and intermittent headaches currently resolved. +Sick contacts at work with GI illness. Known WPW, and doesn't want ablation, not on antiarrhythmics, and has had similar lightheaded symptoms in the past with this. Mother at bedside is also concerned on whether his thyroid could be low. Works outside, concerned about heat exhaustion as well, especially since symptoms worsen with being outside exerting himself.  On exam, mild lower abdominal tenderness, nonperitoneal, neg mcburney's; no focal neuro deficits; no tachycardia or hypoxia, PERC neg, doubt PE; no CP complaint, although he states sometimes he gets SOB with exertion. EKG with delta waves c/w WPW; no acute ischemic findings. Symptoms could be multifactorial, including gastroenteritis in addition to heat exhaustion, maybe WPW causing the presyncopal sensation. Will check CBC w/diff,  CMP, lipase, U/A, orthostatics, CXR, trop, TSH, and CK. Pt denies wanting pain or nausea meds. Will give fluids. Doubt need for CT head or abdominal imaging. Will reassess shortly   4:08 PM U/A clear, trop neg, CBC and CMP WNL, CK and TSH WNL, lipase WNL. CXR WNL. Orthostatics+ with HR increase by 20bpm with standing, slight drop in SBP with sitting/standing but not more than . Overall, symptoms likely from  slight dehydration, possibly from gastroenteritis and heat exhaustion, although could be some component of WPW causing symptoms. He feels better, no longer lightheaded with standing, after the 2L fluids. Discussed staying very well hydrated, rx for zofran given in case his nausea returns, BRAT diet discussed, and f/up with PCP and his cardiologist in 5-7 days for recheck of symptoms and ongoing management. I explained the diagnosis and have given explicit precautions to return to the ER including for any other new or worsening symptoms. The patient understands and accepts the medical plan as it's been dictated and I have answered their questions. Discharge instructions concerning home care and prescriptions have been given. The patient is STABLE and is discharged to home in good condition.  BP 109/72 mmHg  Pulse 64  Temp(Src) 98.1 F (36.7 C) (Oral)  Resp 16  Ht 6\' 2"  (1.88 m)  Wt 70.308 kg  BMI 19.89 kg/m2  SpO2 100%  Meds ordered this encounter  Medications  . sodium chloride 0.9 % bolus 1,000 mL    Sig:   . sodium chloride 0.9 % bolus 1,000 mL    Sig:   . ondansetron (ZOFRAN ODT) 4 MG disintegrating tablet    Sig: Take 1 tablet (4 mg total) by mouth every 8 (eight) hours as needed for nausea or vomiting.    Dispense:  15 tablet    Refill:  0    Order Specific Question:  Supervising Provider    Answer:  Eber Hong [[id number]]     Shonette Rhames Camprubi-Soms, PA-C 03/06/16 1620  Azalia Bilis, MD 03/06/16 1714

## 2016-04-02 ENCOUNTER — Ambulatory Visit: Payer: BLUE CROSS/BLUE SHIELD | Admitting: Internal Medicine

## 2016-04-13 ENCOUNTER — Ambulatory Visit: Payer: BLUE CROSS/BLUE SHIELD | Admitting: Internal Medicine

## 2016-12-02 ENCOUNTER — Encounter: Payer: Self-pay | Admitting: Physician Assistant

## 2016-12-02 ENCOUNTER — Ambulatory Visit (INDEPENDENT_AMBULATORY_CARE_PROVIDER_SITE_OTHER): Payer: PRIVATE HEALTH INSURANCE | Admitting: Physician Assistant

## 2016-12-02 VITALS — BP 110/78 | HR 76 | Temp 98.0°F | Resp 14 | Ht 71.75 in | Wt 150.0 lb

## 2016-12-02 DIAGNOSIS — B078 Other viral warts: Secondary | ICD-10-CM

## 2016-12-02 DIAGNOSIS — I456 Pre-excitation syndrome: Secondary | ICD-10-CM

## 2016-12-02 DIAGNOSIS — K4090 Unilateral inguinal hernia, without obstruction or gangrene, not specified as recurrent: Secondary | ICD-10-CM | POA: Insufficient documentation

## 2016-12-02 NOTE — Patient Instructions (Signed)
Avoid heavy lifting and overexertion. I am setting you up with General Surgery. If you do not hear from them within a week, please call me.  If you note any significant pain in the area or difficulty with bowel movements, please go to the ER.  Keep skin on hand clean and dry.  We may need to repeat wart treatment in 2 weeks.  Please schedule an appointment for a complete physical at your earliest convenience.

## 2016-12-02 NOTE — Assessment & Plan Note (Signed)
Of hand. Cryotherapy x 4 applied. Supportive measures discussed.

## 2016-12-02 NOTE — Progress Notes (Signed)
Pre visit review using our clinic review tool, if applicable. No additional management support is needed unless otherwise documented below in the visit note. 

## 2016-12-02 NOTE — Assessment & Plan Note (Signed)
R-sided. Visible on exam. Reducible. Mild tenderness with palpation. Otherwise asymptomatic. Referral to Surgery placed for further assessment and potential repair. Alarm signs/symptoms discussed with patient.

## 2016-12-02 NOTE — Assessment & Plan Note (Signed)
Followed by electrophysiology. Declines intervention. Denies symptoms in the past 6 months. Vitals stable. Discussed risks associated with untreated WPW that is symptomatic. He voices understanding.

## 2016-12-02 NOTE — Progress Notes (Signed)
Patient presents to clinic today to establish care.  Acute Concerns: Patient c/o 1 week of bulge noted in R groin. Noted an episode of heavy lifting prior to symptom onset -- Was lifting/moving a refrigerator. Denies any pain or popping during the heavy lifting. Denies pain since then. Denies change to bowel or bladder habits.   Patient also endorses bumps of left hand that have been present for several months. Denies pain, itching. Thinks they may be warts.  Chronic Issues: WPW -- diagnosed at age 21. Is followed by Electrophysiology. Was also diagnosed with Paroxysmal SVT. BBs and ablation were recommended. Patient declines. Declines any recurrence of symptoms over the past 6 years.    Past Medical History:  Diagnosis Date  . Arrhythmia   . History of chickenpox   . Migraine   . PSVT (paroxysmal supraventricular tachycardia) (HCC)   . WPW (Wolff-Parkinson-White syndrome)     Past Surgical History:  Procedure Laterality Date  . NO PAST SURGERIES      No current outpatient prescriptions on file prior to visit.   No current facility-administered medications on file prior to visit.     Allergies  Allergen Reactions  . Penicillins Other (See Comments)    Reaction unknown     Family History  Problem Relation Age of Onset  . Cancer Maternal Grandfather      Pancreatic  . Heart disease Paternal Grandmother   . CAD Paternal Grandfather   . Prostate cancer Paternal Grandfather     Social History   Social History  . Marital status: Single    Spouse name: N/A  . Number of children: N/A  . Years of education: 36   Occupational History  . Landscaping    Social History Main Topics  . Smoking status: Never Smoker  . Smokeless tobacco: Never Used  . Alcohol use No  . Drug use: No  . Sexual activity: No   Other Topics Concern  . Not on file   Social History Narrative  . No narrative on file   Review of Systems  Constitutional: Negative for fever and weight  loss.  HENT: Negative for ear discharge, ear pain, hearing loss and tinnitus.   Eyes: Negative for blurred vision, double vision, photophobia and pain.  Respiratory: Negative for cough and shortness of breath.   Cardiovascular: Negative for chest pain and palpitations.  Gastrointestinal: Negative for abdominal pain, blood in stool, constipation, diarrhea, heartburn, melena, nausea and vomiting.  Genitourinary: Negative for dysuria, flank pain, frequency, hematuria and urgency.       + bulge R inguinal region  Musculoskeletal: Negative for falls.  Neurological: Negative for dizziness, loss of consciousness and headaches.  Endo/Heme/Allergies: Negative for environmental allergies.  Psychiatric/Behavioral: Negative for depression, hallucinations, substance abuse and suicidal ideas. The patient is not nervous/anxious and does not have insomnia.    BP 110/78   Pulse 76   Temp 98 F (36.7 C) (Oral)   Resp 14   Ht 5' 11.75" (1.822 m)   Wt 150 lb (68 kg)   SpO2 99%   BMI 20.49 kg/m   Physical Exam  Constitutional: He is oriented to person, place, and time and well-developed, well-nourished, and in no distress.  HENT:  Head: Normocephalic and atraumatic.  Eyes: Conjunctivae are normal.  Cardiovascular: Normal rate, regular rhythm, normal heart sounds and intact distal pulses.   Pulmonary/Chest: Effort normal and breath sounds normal. No respiratory distress. He has no wheezes. He has no rales. He exhibits no tenderness.  Abdominal: Soft. Bowel sounds are normal. He exhibits no distension. There is no tenderness. A hernia is present. Hernia confirmed positive in the right inguinal area.  Genitourinary: Testes/scrotum normal and penis normal.  Neurological: He is alert and oriented to person, place, and time.  Skin: Skin is warm and dry.     Psychiatric: Affect normal.  Vitals reviewed.  Assessment/Plan: WPW (Wolff-Parkinson-White syndrome) Followed by electrophysiology. Declines  intervention. Denies symptoms in the past 6 months. Vitals stable. Discussed risks associated with untreated WPW that is symptomatic. He voices understanding.   Other viral warts Of hand. Cryotherapy x 4 applied. Supportive measures discussed.   Direct inguinal hernia R-sided. Visible on exam. Reducible. Mild tenderness with palpation. Otherwise asymptomatic. Referral to Surgery placed for further assessment and potential repair. Alarm signs/symptoms discussed with patient.     Piedad Climes, PA-C

## 2017-02-17 ENCOUNTER — Telehealth: Payer: Self-pay | Admitting: Physician Assistant

## 2017-02-17 ENCOUNTER — Encounter: Payer: Self-pay | Admitting: Physician Assistant

## 2017-02-17 ENCOUNTER — Ambulatory Visit (INDEPENDENT_AMBULATORY_CARE_PROVIDER_SITE_OTHER): Payer: PRIVATE HEALTH INSURANCE | Admitting: Physician Assistant

## 2017-02-17 VITALS — BP 100/60 | HR 100 | Temp 98.2°F | Ht 71.75 in | Wt 150.4 lb

## 2017-02-17 DIAGNOSIS — B078 Other viral warts: Secondary | ICD-10-CM

## 2017-02-17 DIAGNOSIS — I456 Pre-excitation syndrome: Secondary | ICD-10-CM

## 2017-02-17 DIAGNOSIS — L237 Allergic contact dermatitis due to plants, except food: Secondary | ICD-10-CM | POA: Diagnosis not present

## 2017-02-17 MED ORDER — BETAMETHASONE DIPROPIONATE 0.05 % EX CREA: TOPICAL_CREAM | Freq: Every day | CUTANEOUS | 0 refills | Status: DC

## 2017-02-17 MED ORDER — CLOBETASOL PROPIONATE 0.025 % EX CREA: 1.0000 "application " | TOPICAL_CREAM | Freq: Every day | CUTANEOUS | 0 refills | Status: DC

## 2017-02-17 NOTE — Telephone Encounter (Signed)
Called pharmacy and spoke to [place], asked her if there was something cheaper that pt could use. Told her okay to change dosage 0.05% per Lelon MastSamantha if need to. Tiffany said Betamethasone cream 0.05 % would be cheapest. Told her that if fine, verbal order given to change Rx. Tiffany verbalized understanding.

## 2017-02-17 NOTE — Progress Notes (Addendum)
Paul Bean is a 21 y.o. male here for poison ivy and wart removal.  I acted as a Neurosurgeon for [employer], PA-C Corky Mull, LPN  History of Present Illness:   Chief Complaint  Patient presents with  . Poison Ivy    Left hand between 3 and 4th finger  . Plantar Warts    3 & 4th fingers Left hand and left thumb   Warts Pt have several planters warts on his 3rd and 4th fingers left hand and left thumb he would like to be refrozen today. Was done about one month ago and has also tried OTC Freeze  and acetone x 1 week. Not helped much.   Poison [place]  This is a new problem. Episode onset: started on Monday. The problem has been gradually worsening since onset. The affected locations include the left fingers. The rash is characterized by blistering and itchiness. 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 Pertinent negatives include no cough, diarrhea, eye pain, fatigue, fever, nail changes, shortness of breath or sore throat. Past treatments include anti-itch cream (water & baking soda). The treatment provided no relief.   Patient has a history of WPW -- was told to consider ablation and/or antiarrhythmics, this has not been done, he tells me that he is "afraid to do this."    Past Medical History:  Diagnosis Date  . Arrhythmia   . History of chickenpox   . Migraine   . PSVT (paroxysmal supraventricular tachycardia) (HCC)   . WPW (Wolff-Parkinson-White syndrome)      Social History   Social History  . Marital status: Single    Spouse name: N/A  . Number of children: N/A  . Years of education: 2   Occupational History  . Landscaping    Social History Main Topics  . Smoking status: Never Smoker  . Smokeless tobacco: Never Used  . Alcohol use No  . Drug use: No  . Sexual activity: No   Other Topics Concern  . Not on file   Social History Narrative  . No narrative on file    Past Surgical History:  Procedure Laterality Date  . NO PAST SURGERIES       Family History  Problem Relation Age of Onset  . Cancer Maternal Grandfather         Pancreatic  . Heart disease Paternal Grandmother   . CAD Paternal Grandfather   . Prostate cancer Paternal Grandfather     Allergies  Allergen Reactions  . Penicillins Other (See Comments)    Reaction unknown     Current Medications:   Current Outpatient Prescriptions:  .  Clobetasol Propionate 0.025 % CREA, Apply 1 application topically daily., Disp: 60 g, Rfl: 0   Review of Systems:   Review of Systems  Constitutional: Negative for chills, fatigue, fever, malaise/fatigue and weight loss.  HENT: Negative for sore throat.   Eyes: Negative for pain.  Respiratory: Negative for cough and shortness of breath.   Gastrointestinal: Negative for diarrhea.  Skin: Positive for itching and rash. Negative for nail changes.    Vitals:   Vitals:   02/17/17 1254  BP: 100/60  Pulse: 100  Temp: 98.2 F (36.8 C)  TempSrc: Oral  SpO2: 98%  Weight: 150 lb 6.1 oz (68.2 kg)  Height: 5' 11.75" (1.822 m)     Body mass index is 20.54 kg/m.  Physical Exam:   Physical Exam  Constitutional: He appears well-developed. He is cooperative.  Non-toxic  appearance. He does not have a sickly appearance. He does not appear ill. No distress.  Cardiovascular: Normal rate, regular rhythm, S1 normal, S2 normal, normal heart sounds and normal pulses.   No LE edema  Pulmonary/Chest: Effort normal and breath sounds normal.  Neurological: He is alert.  Skin: Skin is warm and dry.  Numerous verrucous lesions, >10, on dorsum of L hand; numerous clear-filled vesicles along lateral third and medial fourth finger of L hand -- no evidence of erythema or infection present  Psychiatric: He has a normal mood and affect. His speech is normal and behavior is normal. Thought content normal.  Nursing note and vitals reviewed.   Assessment and Plan:    [place] was seen today for poison ivy and plantar warts.  Diagnoses  and all orders for this visit:  Poison oak dermatitis Will use topical betamethasone diproprionate (Diprolene) given hx of WPW (see below.)  WPW (Wolff-Parkinson-White syndrome) Discussed briefly with patient that because of this diagnosis and that it is currently untreated, I am not comfortable using oral or injectable steroids for his poison oak dermatitis. I strongly encouraged follow-up with cardiology and PCP for further evaluation and treatment.  Other viral warts He has freezing spray at home but was not sure how to use it -- we discussed this. Histofreeze was applied to one wart at the dorsal aspect of middle phalanx on L hand to show patient how to use freeze spray at home if needed. We also discussed use of duct tape with pumice stone/emery board. He did not respond to spray with PCP and now has >10 warts. I have given him a list to dermatology and recommended further evaluation by them or PCP. We also discussed use of CompoundW.    . Reviewed expectations re: course of current medical issues. . Discussed self-management of symptoms. . Outlined signs and symptoms indicating need for more acute intervention. . Patient verbalized understanding and all questions were answered. . See orders for this visit as documented in the electronic medical record. . Patient received an After-Visit Summary.  CMA or LPN served as scribe during this visit. History, Physical, and Plan performed by medical provider. Documentation and orders reviewed and attested to.  Jarold MottoSamantha Robel Wuertz, PA-C

## 2017-02-17 NOTE — Telephone Encounter (Signed)
Left message on voicemail to call office.  

## 2017-02-17 NOTE — Telephone Encounter (Signed)
[facility] is calling to advise that Clobetasol Propionate 0.025 % CREA [[id number]][[id number]] is almost $500 under the patients insurance. She states even the generic is $178. She is asking if there is something else you can call in. She was unable to provide me with any comparable RX that fall under his formulary.

## 2017-02-17 NOTE — Telephone Encounter (Signed)
I have reviewed patient's chart prior to arrival. I see that patient has a documented history of Wolff-Parkinson-White syndrome and that he is coming in for evaluation of poison ivy. Please let patient know that I am happy to see him for poison ivy, however, given his history of Wolff-Parkinson-White, I am not comfortable treating with oral or injectable steroids if that is what he is anticipating. We can definitely discuss other treatment modalities (topical creams, antihistamines), however I want to be respectful of patient's time and inform him of this now.  Thanks [employer]Samantha Calaya Gildner PA-C, 02/17/17

## 2017-02-17 NOTE — Patient Instructions (Addendum)
It was great to meet you!  Please see Selena BattenCody about your warts, or call one of the dermatologists for further evaluation.  Apply the cream to your poison ivy daily. May take Benadryl, Allegra or Zyrtec (you can buy this at the drug store). Follow-up if worsens.

## 2017-02-17 NOTE — Telephone Encounter (Signed)
Pt called back, told him Lelon MastSamantha reviewed your chart and sees that you have a documented history of Wolff-Parkinson-White syndrome . She wanted to  let patient know that she is  happy to see you for poison ivy, however, given your history of Wolff-Parkinson-White, she is not comfortable treating with oral or injectable steroids if that is what you are anticipating. We can definitely discuss other treatment modalities (topical creams, antihistamines), however I want to be respectful of patient's time and inform him of this now. Pt verbalized understanding and said that is fine just needs something else that will work, topical [facility] stuff is not working. Told pt okay we will see you at 1:00 PM. Pt verbalized understanding.
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
## 2018-06-16 IMAGING — CR DG CHEST 2V
2 series · 2 of 2 positions shown · non-contrast
Comparison: None.

CLINICAL DATA: Weakness and abdominal pain.

EXAM:
CHEST  2 VIEW

[w chest pa]
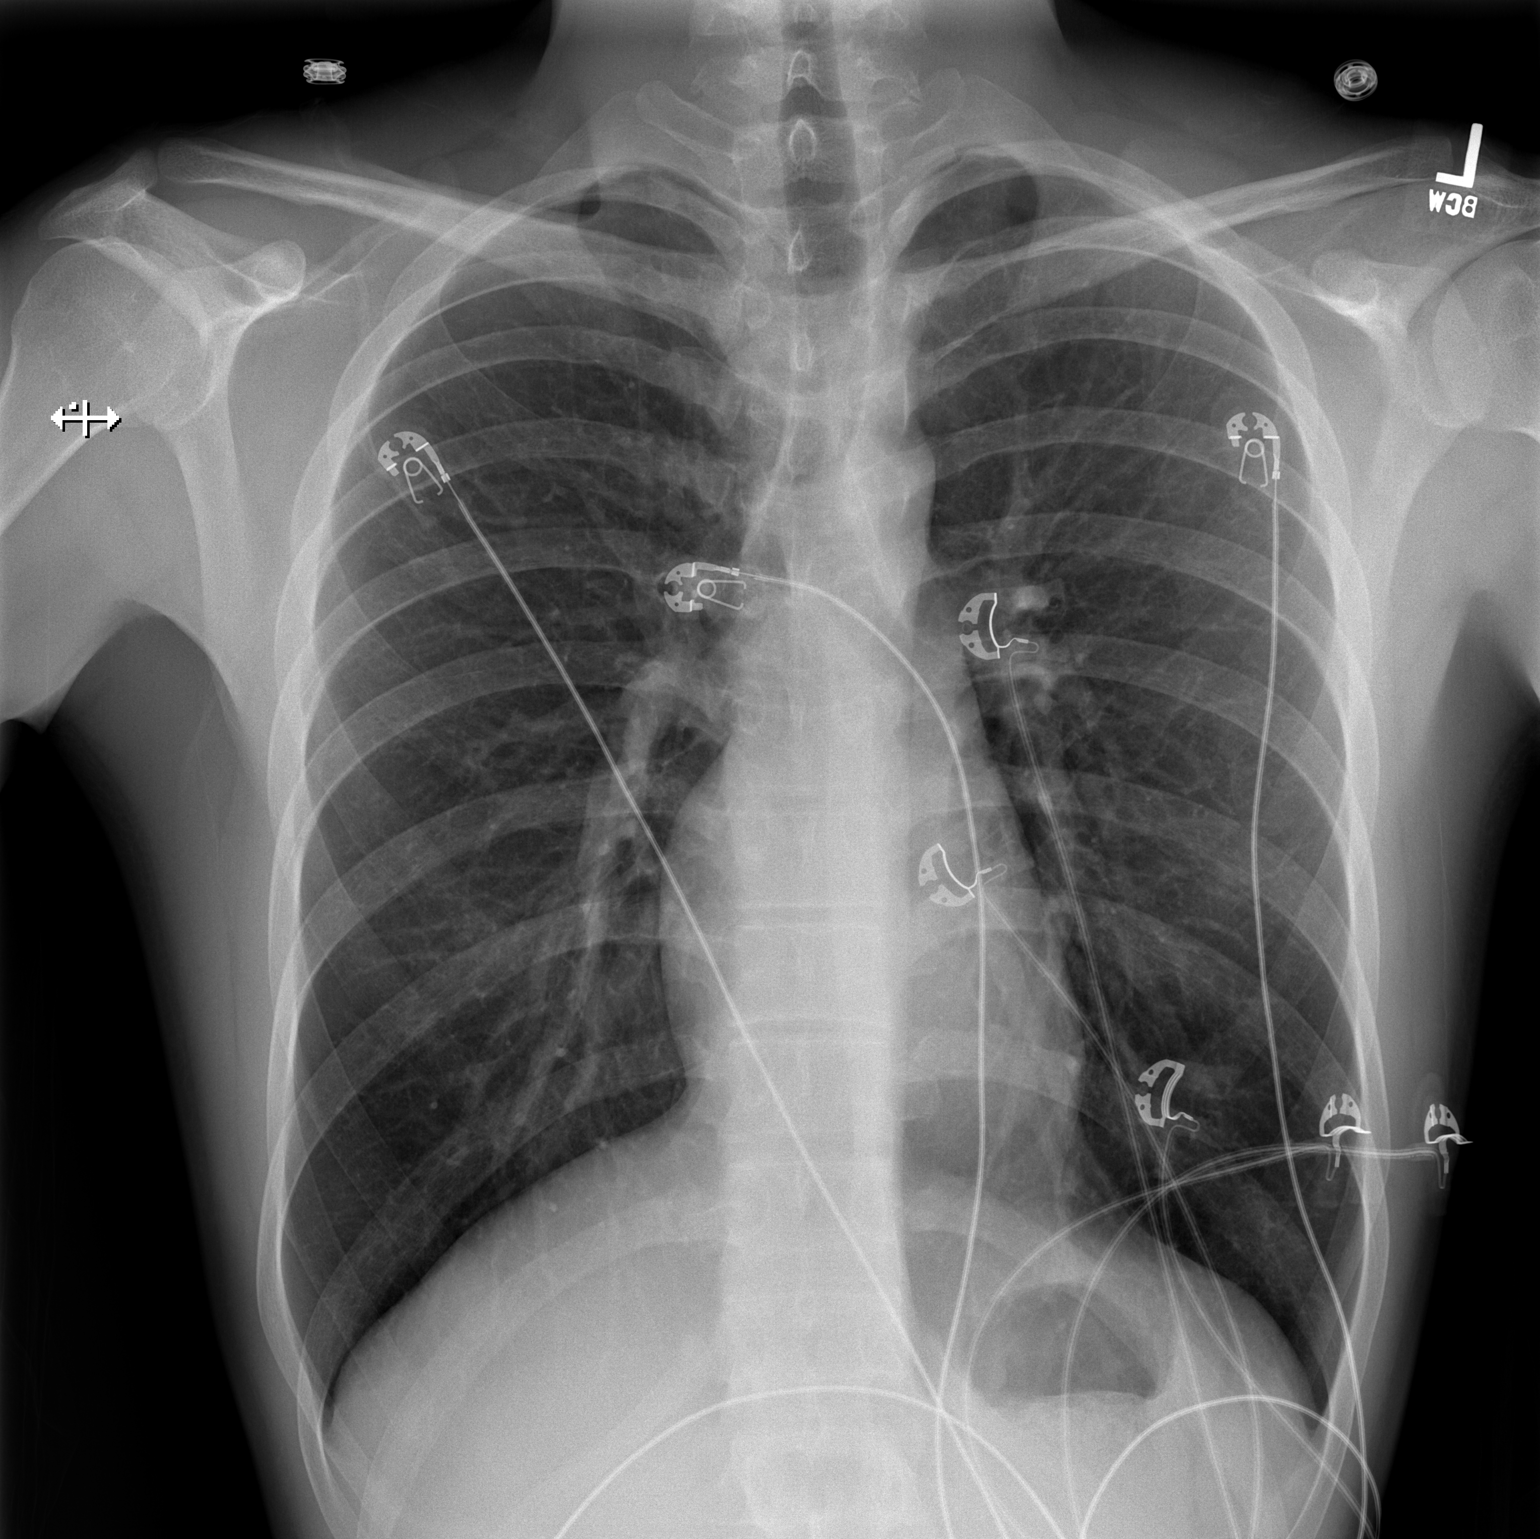

[w chest lat]
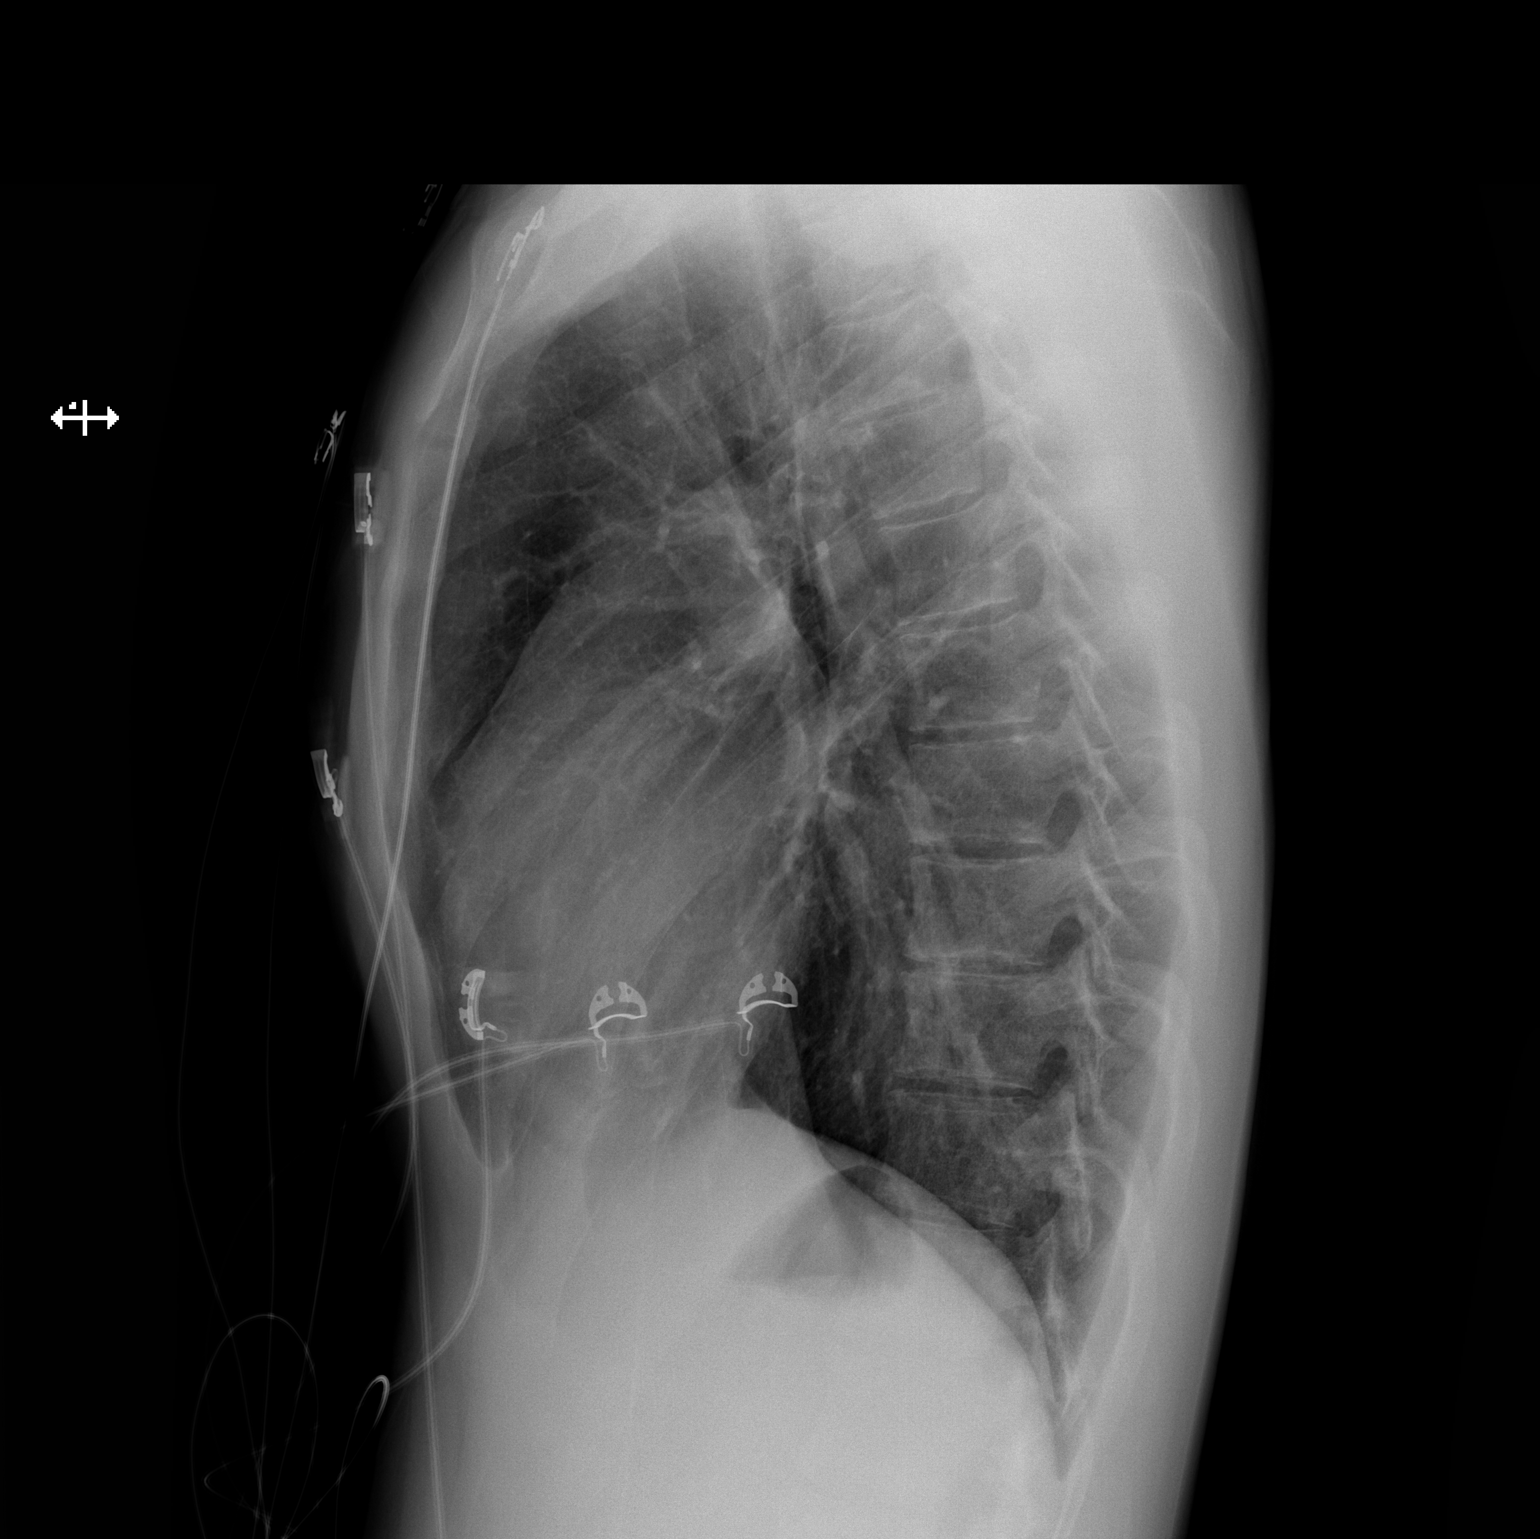

[2 of 2 positions shown; findings below may reference images not displayed]

FINDINGS: The heart size and mediastinal contours are within normal limits.
There is no evidence of pulmonary edema, consolidation,
pneumothorax, nodule or pleural fluid. The visualized skeletal
structures are unremarkable.
IMPRESSION: No active cardiopulmonary disease.

## 2019-04-16 ENCOUNTER — Encounter: Payer: Self-pay | Admitting: *Deleted

## 2021-08-05 ENCOUNTER — Emergency Department (HOSPITAL_COMMUNITY)
Admission: EM | Admit: 2021-08-05 | Discharge: 2021-08-06 | Disposition: A | Discharge status: Home / Self Care | Payer: PRIVATE HEALTH INSURANCE | Attending: Emergency Medicine | Admitting: Emergency Medicine

## 2021-08-05 ENCOUNTER — Encounter (HOSPITAL_COMMUNITY): Payer: Self-pay | Admitting: *Deleted

## 2021-08-05 ENCOUNTER — Ambulatory Visit (HOSPITAL_COMMUNITY)
Admission: EM | Admit: 2021-08-05 | Discharge: 2021-08-05 | Disposition: A | Discharge status: Home / Self Care | Payer: PRIVATE HEALTH INSURANCE | Attending: Physician Assistant | Admitting: Physician Assistant

## 2021-08-05 ENCOUNTER — Encounter (HOSPITAL_COMMUNITY): Payer: Self-pay

## 2021-08-05 ENCOUNTER — Other Ambulatory Visit: Payer: Self-pay

## 2021-08-05 DIAGNOSIS — R002 Palpitations: Secondary | ICD-10-CM | POA: Insufficient documentation

## 2021-08-05 LAB — BASIC METABOLIC PANEL
Anion gap: 8 (ref 5–15)
BUN: 14 mg/dL (ref 6–20)
CO2: 25 mmol/L (ref 22–32)
Calcium: 9.3 mg/dL (ref 8.9–10.3)
Chloride: 106 mmol/L (ref 98–111)
Creatinine, Ser: 0.78 mg/dL (ref 0.61–1.24)
GFR, Estimated: >60 mL/min (ref >60)
Glucose, Bld: 99 mg/dL (ref 70–99)
Potassium: 3.6 mmol/L (ref 3.5–5.1)
Sodium: 139 mmol/L (ref 135–145)

## 2021-08-05 LAB — CBC
HCT: 44.5 % (ref 39.0–52.0)
Hemoglobin: 14.4 g/dL (ref 13.0–17.0)
MCH: 29.9 pg (ref 26.0–34.0)
MCHC: 32.4 g/dL (ref 30.0–36.0)
MCV: 92.3 fL (ref 80.0–100.0)
Platelets: 244 10*3/uL (ref 150–400)
RBC: 4.82 MIL/uL (ref 4.22–5.81)
RDW: 12.7 % (ref 11.5–15.5)
WBC: 5 10*3/uL (ref 4.0–10.5)
nRBC: 0 % (ref 0.0–0.2)

## 2021-08-05 LAB — MAGNESIUM: Magnesium: 2 mg/dL (ref 1.7–2.4)

## 2021-08-05 NOTE — ED Triage Notes (Signed)
Pt reports he has been having irregular heart beats x 4 days.   States he has never had it before and states he feels his heart misses beats and states it seems off rhythm.

## 2021-08-05 NOTE — ED Provider Notes (Signed)
Emergency Medicine Provider Triage Evaluation Note  Paul Bean , a 25 y.o. male  was evaluated in triage.  Pt complains of palpitations.  Patient has a known history of WPW.  He saw cardiology 5 to 6 years ago, but has not had any treatments for the WPW.  He states over the past few days he has had palpitations described as a skipped heartbeat or fluttering.  Several years ago he had more of a racing heart and this is different.  No chest pain or shortness of breath.  Denies caffeine or alcohol use.  Sent from urgent care.  Review of Systems  Positive: Palpitations Negative: Chest pain  Physical Exam  BP (!) 144/88 (BP Location: Right Arm)    Pulse (!) 102    Temp 98.6 F (37 C) (Oral)    Resp 19    SpO2 100%  Gen:   Awake, no distress   Resp:  Normal effort  MSK:   Moves extremities without difficulty  Other:  Heart regular rate and rhythm at time of exam  Medical Decision Making  Medically screening exam initiated at 7:48 PM.  Appropriate orders placed.  Paul Bean was informed that the remainder of the evaluation will be completed by another provider, this initial triage assessment does not replace that evaluation, and the importance of remaining in the ED until their evaluation is complete.     Renne Crigler, PA-C 08/05/21 1949    Sloan Leiter, DO 08/05/21 2146

## 2021-08-05 NOTE — ED Provider Notes (Signed)
I was called into triage to evaluate patient.  Reports a 4-day history of palpitations and lightheadedness/fatigue.  EKG was obtained that showed ventricular preexcitation with WPW pattern A.  Compared to 03/08/2016 tracing increased ST depression in V2 through V4.  Patient does not currently have a cardiologist is not taking any rate controlling occasions.  He denies history of ablation.  Discussed that given EKG changes and that he does not have cardiology follow-up the safest thing to do would be to go to the emergency room given symptoms.  Patient is agreeable to this and will go directly to [facility] emergency room.  Vital signs were stable at the time of discharge and he was safe for private transport.  Case discussed with Dr. Tracie Harrier who agreed with treatment plan.   Jeani Hawking, PA-C 08/05/21 1923

## 2021-08-05 NOTE — ED Notes (Signed)
Patient is being discharged from the Urgent Care and sent to the Emergency Department via POV . Per Dorann Ou PA, patient is in need of higher level of care due to abnormal EKG. Patient is aware and verbalizes understanding of plan of care.  Vitals:   08/05/21 1859 08/05/21 1900  BP:  137/89  Pulse: 86   Resp: 17   Temp:  98.2 F (36.8 C)  SpO2: 96%

## 2021-08-05 NOTE — ED Triage Notes (Signed)
Pt c/o palpitations x 4-5 days with intermittent sob. Hx of WPW. NO changed in caffeine intake or medications

## 2021-08-06 MED ORDER — POTASSIUM CHLORIDE CRYS ER 20 MEQ PO TBCR
40.0000 meq | EXTENDED_RELEASE_TABLET | Freq: Once | ORAL | Status: AC
  Administered 2021-08-06: 04:00:00 40 meq via ORAL
  Filled 2021-08-06: qty 2

## 2021-08-06 NOTE — Discharge Instructions (Signed)
Please stay hydrated.  Avoid stimulates including caffeine.

## 2021-08-06 NOTE — ED Provider Notes (Signed)
[facility] EMERGENCY DEPARTMENT Provider Note   CSN: [id number] Arrival date & time: 08/05/21  1929     History Chief Complaint  Patient presents with   Palpitations    Paul Bean is a 25 y.o. male.  Patient with past medical history notable for Wolff-Parkinson-White presents to the emergency department with a chief complaint of palpitations.  He states that he is noticed intermittent palpitations for the past 2 to 3 days.  He denies any significant stimulant use, but states he does have the occasional caffeinated drink.  He denies any chest pain or shortness of breath.  States that he has had SVT in the past, but this feels different.  States that he was referred to Dr. Ladona Ridgel, and was advised to have an ablation, but patient never followed up.  The history is provided by the patient. No language interpreter was used.      Past Medical History:  Diagnosis Date   Arrhythmia    History of chickenpox    Migraine    PSVT (paroxysmal supraventricular tachycardia) (HCC)    WPW (Wolff-Parkinson-White syndrome)     Patient Active Problem List   Diagnosis Date Noted   Direct inguinal hernia 12/02/2016   Other viral warts 12/02/2016   PSVT (paroxysmal supraventricular tachycardia) (HCC) 10/22/2015   WPW (Wolff-Parkinson-White syndrome) 10/22/2015    Past Surgical History:  Procedure Laterality Date   NO PAST SURGERIES         Family History  Problem Relation Age of Onset   Cancer Maternal Grandfather         Pancreatic   Heart disease Paternal Grandmother    CAD Paternal Grandfather    Prostate cancer Paternal Grandfather     Social History   Tobacco Use   Smoking status: Never   Smokeless tobacco: Never  Substance Use Topics   Alcohol use: No   Drug use: No    Home Medications Prior to Admission medications   Medication Sig Start Date End Date Taking? Authorizing Provider  betamethasone dipropionate (DIPROLENE) 0.05 % cream Apply  topically daily. 02/17/17   Jarold Motto, PA    Allergies    Penicillins  Review of Systems   Review of Systems  All other systems reviewed and are negative.  Physical Exam Updated Vital Signs BP 105/61 (BP Location: Right Arm)    Pulse (!) 59    Temp 97.9 F (36.6 C) (Oral)    Resp 20    SpO2 100%   Physical Exam Vitals and nursing note reviewed.  Constitutional:      General: He is not in acute distress.    Appearance: He is well-developed.  HENT:     Head: Normocephalic and atraumatic.  Eyes:     Conjunctiva/sclera: Conjunctivae normal.  Cardiovascular:     Rate and Rhythm: Normal rate and regular rhythm.     Heart sounds: No murmur heard. Pulmonary:     Effort: Pulmonary effort is normal. No respiratory distress.     Breath sounds: Normal breath sounds.  Abdominal:     Palpations: Abdomen is soft.     Tenderness: There is no abdominal tenderness.  Musculoskeletal:        General: No swelling.     Cervical back: Neck supple.  Skin:    General: Skin is warm and dry.     Capillary Refill: Capillary refill takes less than 2 seconds.  Neurological:     Mental Status: He is alert.  Psychiatric:  Mood and Affect: Mood normal.    ED Results / Procedures / Treatments   Labs (all labs ordered are listed, but only abnormal results are displayed) Labs Reviewed  CBC  BASIC METABOLIC PANEL  MAGNESIUM  TSH    EKG EKG Interpretation  Date/Time:  Thursday August 06 2021 03:21:13 EST Ventricular Rate:  70 PR Interval:  107 QRS Duration: 159 QT Interval:  434 QTC Calculation: 469 R Axis:   -75 Text Interpretation: Sinus rhythm Ventricular pre-excitation, WPW pattern type A When compared with ECG of 08/05/2021, Premature atrial complexes are no longer present Confirmed by Dione Booze ([id number]) on 08/06/2021 3:23:58 AM  Radiology No results found.  Procedures Procedures   Medications Ordered in ED Medications  potassium chloride SA (KLOR-CON M) CR  tablet 40 mEq (has no administration in time range)    ED Course  I have reviewed the triage vital signs and the nursing notes.  Pertinent labs & imaging results that were available during my care of the patient were reviewed by me and considered in my medical decision making (see chart for details).    MDM Rules/Calculators/A&P                         Patient here with intermittent palpitations for the past few days.  Laboratory work-up is fairly reassuring.  Potassium is on the lower side of normal.  EKG consistent with Wolff-Parkinson-White.  Will optimize K.  EKG and case discussed with Dr. Preston Fleeting, who agrees with outpatient follow-up.  I discussed the discharge and follow-up plan with the patient.  He agrees and will follow-up with cardiology.    Final Clinical Impression(s) / ED Diagnoses Final diagnoses:  Palpitations    Rx / DC Orders ED Discharge Orders     None        Roxy Horseman, PA-C 08/06/21 5974    Dione Booze, MD 08/06/21 [phone]

## 2021-09-14 ENCOUNTER — Institutional Professional Consult (permissible substitution): Payer: Self-pay | Admitting: Internal Medicine

## 2021-10-06 ENCOUNTER — Encounter: Payer: Self-pay | Admitting: Internal Medicine

## 2021-10-06 ENCOUNTER — Ambulatory Visit: Payer: 59 | Admitting: Internal Medicine

## 2021-10-06 ENCOUNTER — Other Ambulatory Visit: Payer: Self-pay

## 2021-10-06 VITALS — BP 110/72 | HR 96 | Ht 73.0 in | Wt 182.0 lb

## 2021-10-06 DIAGNOSIS — I471 Supraventricular tachycardia: Secondary | ICD-10-CM

## 2021-10-06 DIAGNOSIS — I456 Pre-excitation syndrome: Secondary | ICD-10-CM | POA: Diagnosis not present

## 2021-10-06 NOTE — Progress Notes (Signed)
° ° ° ° °  HPI Mr. Bonifay returns today after an almost 6 year absence from our EP clinic. He is a pleasant 26 yo man with a h/o WPW syndrome. He has a h/o SVT and palpitations and near syncope associated with rapid heart racing. He has not been on medical therapy. He does not use ETOH or caffeine. He thinks that his symptoms have worsened.  Allergies  Allergen Reactions   Penicillins Other (See Comments)    Reaction unknown      No current outpatient medications on file.   No current facility-administered medications for this visit.     Past Medical History:  Diagnosis Date   Arrhythmia    History of chickenpox    Migraine    PSVT (paroxysmal supraventricular tachycardia) (HCC)    WPW (Wolff-Parkinson-White syndrome)     ROS:   All systems reviewed and negative except as noted in the HPI.   Past Surgical History:  Procedure Laterality Date   NO PAST SURGERIES       Family History  Problem Relation Age of Onset   Cancer Maternal Grandfather         Pancreatic   Heart disease Paternal Grandmother    CAD Paternal Grandfather    Prostate cancer Paternal Grandfather      Social History   Socioeconomic History   Marital status: Single    Spouse name: Not on file   Number of children: Not on file   Years of education: 12   Highest education level: Not on file  Occupational History   Occupation: Landscaping  Tobacco Use   Smoking status: Never   Smokeless tobacco: Never  Substance and Sexual Activity   Alcohol use: No   Drug use: No   Sexual activity: Never  Other Topics Concern   Not on file  Social History Narrative   Not on file   Social Determinants of Health   Financial Resource Strain: Not on file  Food Insecurity: Not on file  Transportation Needs: Not on file  Physical Activity: Not on file  Stress: Not on file  Social Connections: Not on file  Intimate Partner Violence: Not on file     BP 110/72    Pulse 96    Ht 6\' 1"  (1.854 m)    Wt  182 lb (82.6 kg)    SpO2 97%    BMI 24.01 kg/m   Physical Exam:  Well appearing 26 yo man, NAD HEENT: Unremarkable Neck:  6 cmJVD, no thyromegally Lymphatics:  No adenopathy Back:  No CVA tenderness Lungs:  Clear with no wheezes HEART:  Regular rate rhythm, no murmurs, no rubs, no clicks Abd:  soft, positive bowel sounds, no organomegally, no rebound, no guarding Ext:  2 plus pulses, no edema, no cyanosis, no clubbing Skin:  No rashes no nodules Neuro:  CN II through XII intact, motor grossly intact  EKG - reviewed. NSR with WPW pattern c/w a left posterolateral AP  Assess/Plan:  Symptomatic WPW syndrome - I have discussed the treatment options with the patient and the risks/benefits/goasl/expectations of EP study and catheter ablation were reviewed and he wishes to proceed. Carleene Overlie Cordelia Bessinger,MD

## 2021-10-06 NOTE — Patient Instructions (Addendum)
Medication Instructions:  Your physician recommends that you continue on your current medications as directed. Please refer to the Current Medication list given to you today.  Labwork: None ordered.  Testing/Procedures: None ordered.  Follow-Up:  The following dates are available for this procedure: March 13, 20, 22, 23, 27 April 3, 5, 11, 18, 19, 24  Any Other Special Instructions Will Be Listed Below (If Applicable).  If you need a refill on your cardiac medications before your next appointment, please call your pharmacy.   Cardiac Ablation Cardiac ablation is a procedure to destroy, or ablate, a small amount of heart tissue in very specific places. The heart has many electrical connections. Sometimes these connections are abnormal and can cause the heart to beat very fast or irregularly. Ablating some of the areas that cause problems can improve the heart's rhythm or return it to normal. Ablation may be done for people who: Have Wolff-Parkinson-White syndrome. Have fast heart rhythms (tachycardia). Have taken medicines for an abnormal heart rhythm (arrhythmia) that were not effective or caused side effects. Have a high-risk heartbeat that may be life-threatening. During the procedure, a small incision is made in the neck or the groin, and a long, thin tube (catheter) is inserted into the incision and moved to the heart. Small devices (electrodes) on the tip of the catheter will send out electrical currents. A type of X-ray (fluoroscopy) will be used to help guide the catheter and to provide images of the heart. Tell a health care provider about: Any allergies you have. All medicines you are taking, including vitamins, herbs, eye drops, creams, and over-the-counter medicines. Any problems you or family members have had with anesthetic medicines. Any blood disorders you have. Any surgeries you have had. Any medical conditions you have, such as kidney failure. Whether you are  pregnant or may be pregnant. What are the risks? Generally, this is a safe procedure. However, problems may occur, including: Infection. Bruising and bleeding at the catheter insertion site. Bleeding into the chest, especially into the sac that surrounds the heart. This is a serious complication. Stroke or blood clots. Damage to nearby structures or organs. Allergic reaction to medicines or dyes. Need for a permanent pacemaker if the normal electrical system is damaged. A pacemaker is a small computer that sends electrical signals to the heart and helps your heart beat normally. The procedure not being fully effective. This may not be recognized until months later. Repeat ablation procedures are sometimes done. What happens before the procedure? Medicines Ask your health care provider about: Changing or stopping your regular medicines. This is especially important if you are taking diabetes medicines or blood thinners. Taking medicines such as aspirin and ibuprofen. These medicines can thin your blood. Do not take these medicines unless your health care provider tells you to take them. Taking over-the-counter medicines, vitamins, herbs, and supplements. General instructions Follow instructions from your health care provider about eating or drinking restrictions. Plan to have someone take you home from the hospital or clinic. If you will be going home right after the procedure, plan to have someone with you for 24 hours. Ask your health care provider what steps will be taken to prevent infection. What happens during the procedure?  An IV will be inserted into one of your veins. You will be given a medicine to help you relax (sedative). The skin on your neck or groin will be numbed. An incision will be made in your neck or your groin. A needle will  be inserted through the incision and into a large vein in your neck or groin. A catheter will be inserted into the needle and moved to your  heart. Dye may be injected through the catheter to help your surgeon see the area of the heart that needs treatment. Electrical currents will be sent from the catheter to ablate heart tissue in desired areas. There are three types of energy that may be used to do this: Heat (radiofrequency energy). Laser energy. Extreme cold (cryoablation). When the tissue has been ablated, the catheter will be removed. Pressure will be held on the insertion area to prevent a lot of bleeding. A bandage (dressing) will be placed over the insertion area. The exact procedure may vary among health care providers and hospitals. What happens after the procedure? Your blood pressure, heart rate, breathing rate, and blood oxygen level will be monitored until you leave the hospital or clinic. Your insertion area will be monitored for bleeding. You will need to lie still for a few hours to ensure that you do not bleed from the insertion area. Do not drive for 24 hours or as long as told by your health care provider. Summary Cardiac ablation is a procedure to destroy, or ablate, a small amount of heart tissue using an electrical current. This procedure can improve the heart rhythm or return it to normal. Tell your health care provider about any medical conditions you may have and all medicines you are taking to treat them. This is a safe procedure, but problems may occur. Problems may include infection, bruising, damage to nearby organs or structures, or allergic reactions to medicines. Follow your health care provider's instructions about eating and drinking before the procedure. You may also be told to change or stop some of your medicines. After the procedure, do not drive for 24 hours or as long as told by your health care provider. This information is not intended to replace advice given to you by your health care provider. Make sure you discuss any questions you have with your health care provider. Document Revised:  06/11/2019 Document Reviewed: 06/11/2019 Elsevier Patient Education  [place].

## 2022-10-14 DIAGNOSIS — Z8249 Family history of ischemic heart disease and other diseases of the circulatory system: Secondary | ICD-10-CM | POA: Diagnosis not present

## 2022-10-14 DIAGNOSIS — Z9181 History of falling: Secondary | ICD-10-CM | POA: Diagnosis not present

## 2022-10-14 DIAGNOSIS — Z833 Family history of diabetes mellitus: Secondary | ICD-10-CM | POA: Diagnosis not present

## 2023-02-22 ENCOUNTER — Encounter: Payer: Self-pay | Admitting: Family Medicine

## 2023-02-22 ENCOUNTER — Ambulatory Visit (INDEPENDENT_AMBULATORY_CARE_PROVIDER_SITE_OTHER): Payer: 59 | Admitting: Family Medicine

## 2023-02-22 VITALS — BP 120/70 | HR 89 | Temp 97.9°F | Ht 73.0 in | Wt 190.0 lb

## 2023-02-22 DIAGNOSIS — Z1159 Encounter for screening for other viral diseases: Secondary | ICD-10-CM

## 2023-02-22 DIAGNOSIS — Z13228 Encounter for screening for other metabolic disorders: Secondary | ICD-10-CM

## 2023-02-22 DIAGNOSIS — Z1329 Encounter for screening for other suspected endocrine disorder: Secondary | ICD-10-CM | POA: Diagnosis not present

## 2023-02-22 DIAGNOSIS — Z114 Encounter for screening for human immunodeficiency virus [HIV]: Secondary | ICD-10-CM | POA: Diagnosis not present

## 2023-02-22 DIAGNOSIS — Z7689 Persons encountering health services in other specified circumstances: Secondary | ICD-10-CM

## 2023-02-22 DIAGNOSIS — K4090 Unilateral inguinal hernia, without obstruction or gangrene, not specified as recurrent: Secondary | ICD-10-CM

## 2023-02-22 DIAGNOSIS — Z130 Encounter for screening for diseases of the blood and blood-forming organs and certain disorders involving the immune mechanism: Secondary | ICD-10-CM | POA: Diagnosis not present

## 2023-02-22 LAB — CBC WITH DIFFERENTIAL/PLATELET
Basophils Absolute: 0 10*3/uL (ref 0.0–0.1)
Basophils Relative: 0.8 % (ref 0.0–3.0)
Eosinophils Absolute: 0.1 10*3/uL (ref 0.0–0.7)
Eosinophils Relative: 1.5 % (ref 0.0–5.0)
HCT: 43.6 % (ref 39.0–52.0)
Hemoglobin: 14.7 g/dL (ref 13.0–17.0)
Lymphocytes Relative: 43.8 % (ref 12.0–46.0)
Lymphs Abs: 1.9 10*3/uL (ref 0.7–4.0)
MCHC: 33.7 g/dL (ref 30.0–36.0)
MCV: 90.3 fl (ref 78.0–100.0)
Monocytes Absolute: 0.3 10*3/uL (ref 0.1–1.0)
Monocytes Relative: 7 % (ref 3.0–12.0)
Neutro Abs: 2 10*3/uL (ref 1.4–7.7)
Neutrophils Relative %: 46.9 % (ref 43.0–77.0)
Platelets: 268 10*3/uL (ref 150.0–400.0)
RBC: 4.83 Mil/uL (ref 4.22–5.81)
RDW: 13.3 % (ref 11.5–15.5)
WBC: 4.3 10*3/uL (ref 4.0–10.5)

## 2023-02-22 LAB — COMPREHENSIVE METABOLIC PANEL
ALT: 14 U/L (ref 0–53)
AST: 16 U/L (ref 0–37)
Albumin: 4.9 g/dL (ref 3.5–5.2)
Alkaline Phosphatase: 69 U/L (ref 39–117)
BUN: 20 mg/dL (ref 6–23)
CO2: 27 mEq/L (ref 19–32)
Calcium: 10 mg/dL (ref 8.4–10.5)
Chloride: 104 mEq/L (ref 96–112)
Creatinine, Ser: 0.91 mg/dL (ref 0.40–1.50)
GFR: 116.64 mL/min (ref >60.00)
Glucose, Bld: 79 mg/dL (ref 70–99)
Potassium: 3.7 mEq/L (ref 3.5–5.1)
Sodium: 140 mEq/L (ref 135–145)
Total Bilirubin: 0.8 mg/dL (ref 0.2–1.2)
Total Protein: 7.6 g/dL (ref 6.0–8.3)

## 2023-02-22 LAB — TSH: TSH: 0.69 u[IU]/mL (ref 0.35–5.50)

## 2023-02-22 NOTE — Patient Instructions (Addendum)
-  It was a pleasure to meet you and look forward to taking care of you.  -Placed a referral to general surgeon for right inguinal hernia. If you do not hear about an appointment or receive a message from MyChart in 2 weeks, please call the office.  -Ordered screening labs. Office will call with lab results.  -Follow up in 1 year for physical.

## 2023-02-22 NOTE — Progress Notes (Signed)
New Patient Office Visit  Subjective    Patient ID: Paul Bean, male    DOB: 02/28/1996  Age: 27 y.o. MRN: [id number]  CC:  Chief Complaint  Patient presents with   Establish Care    Pt is here today to Est care. Pt has C/O of hernia rt side. Pt has been seen for this in past. Pt reports he did see Malva Cogan at one time.    HPI Paul Bean presents to establish care with new provider.   Patients previous primary care provider was Marcelline Mates, [place] with same practice location. Last seen 12/02/2016. He was only seen once.   Specialist: [facility] [address] [facility]-Dr. Sharlot Gowda Taylor-didn't follow up from 10/06/2021.   Patient was seen on 12/02/2016 by previous provider, Maebelle Munroe, PA for a right inguinal hernia that occurred after an episode of heavy lifting. Then on 12/09/2016, he was seen by [facility] with Dr. Hyman Bible. At that time, patient deferred scheduling recommend surgery for laparoscopic right inguinal hernia repair possible bilateral. He reports now it is bothering him. He reports their is a bulge when standing, but resolves when lying down. He reports the bulge is larger than in 2018. Denies urinary or bowel complaints. He reports he will have intermittent abd cramps in the right lower quad. Reports his cramps resolve when lying down. Denies fever.   No outpatient encounter medications on file as of 02/22/2023.   No facility-administered encounter medications on file as of 02/22/2023.    Past Medical History:  Diagnosis Date   Arrhythmia    History of chickenpox    Migraine    PSVT (paroxysmal supraventricular tachycardia)    WPW (Wolff-Parkinson-White syndrome)     Past Surgical History:  Procedure Laterality Date   NO PAST SURGERIES      Family History  Problem Relation Age of Onset   Cancer Maternal Grandfather         Pancreatic   Heart disease Paternal Grandmother    CAD Paternal  Grandfather    Prostate cancer Paternal Grandfather     Social History   Socioeconomic History   Marital status: Single    Spouse name: Not on file   Number of children: 0   Years of education: 12   Highest education level: High school graduate  Occupational History   Occupation: Landscaping  Tobacco Use   Smoking status: Never   Smokeless tobacco: Never  Vaping Use   Vaping Use: Never used  Substance and Sexual Activity   Alcohol use: No   Drug use: No   Sexual activity: Never  Other Topics Concern   Not on file  Social History Narrative   Not on file   Social Determinants of Health   Financial Resource Strain: Low Risk  (02/22/2023)   Overall Financial Resource Strain (CARDIA)    Difficulty of Paying Living Expenses: Not hard at all  Food Insecurity: No Food Insecurity (02/22/2023)   Hunger Vital Sign    Worried About Running Out of Food in the Last Year: Never true    Ran Out of Food in the Last Year: Never true  Transportation Needs: No Transportation Needs (02/22/2023)   PRAPARE - Administrator, Civil Service (Medical): No    Lack of Transportation (Non-Medical): No  Physical Activity: Inactive (02/22/2023)   Exercise Vital Sign    Days of Exercise per Week: 0 days    Minutes of Exercise per  Session: 0 min  Stress: No Stress Concern Present (02/22/2023)   [employer] of Occupational Health - Occupational Stress Questionnaire    Feeling of Stress : Not at all  Social Connections: Moderately Isolated (02/22/2023)   Social Connection and Isolation Panel [NHANES]    Frequency of Communication with Friends and Family: More than three times a week    Frequency of Social Gatherings with Friends and Family: Twice a week    Attends Religious Services: 1 to 4 times per year    Active Member of [employer] or Organizations: No    Attends Banker Meetings: Never    Marital Status: Never married  Intimate Partner Violence: Not At Risk (02/22/2023)    Humiliation, Afraid, Rape, and Kick questionnaire    Fear of Current or Ex-Partner: No    Emotionally Abused: No    Physically Abused: No    Sexually Abused: No    ROS See HPI above    Objective   BP 120/70   Pulse 89   Temp 97.9 F (36.6 C)   Ht 6\' 1"  (1.854 m)   Wt 190 lb (86.2 kg)   SpO2 99%   BMI 25.07 kg/m   Physical Exam Vitals reviewed.  Constitutional:      General: He is not in acute distress.    Appearance: Normal appearance. He is not ill-appearing, toxic-appearing or diaphoretic.  Eyes:     General:        Right eye: No discharge.        Left eye: No discharge.     Conjunctiva/sclera: Conjunctivae normal.  Cardiovascular:     Rate and Rhythm: Normal rate and regular rhythm.     Heart sounds: Normal heart sounds. No murmur heard.    No friction rub. No gallop.  Pulmonary:     Effort: Pulmonary effort is normal. No respiratory distress.     Breath sounds: Normal breath sounds.  Abdominal:     Tenderness: There is no abdominal tenderness.     Hernia: A hernia (Right inguinal) is present.  Musculoskeletal:        General: Normal range of motion.  Skin:    General: Skin is warm and dry.  Neurological:     General: No focal deficit present.     Mental Status: He is alert and oriented to person, place, and time. Mental status is at baseline.  Psychiatric:        Mood and Affect: Mood normal.        Behavior: Behavior normal.        Thought Content: Thought content normal.        Judgment: Judgment normal.      Assessment & Plan:  Direct inguinal hernia -     Ambulatory referral to General Surgery  Encounter for screening for HIV -     HIV Antibody (routine testing w rflx)  Need for hepatitis C screening test -     Hepatitis C antibody  Screening for endocrine, metabolic, and immunity disorder -     CBC with Differential/Platelet -     Comprehensive metabolic panel -     TSH  Encounter to establish care  1.Review health  maintenance: -Declines covid vaccine and booster -Ordered HIV and Hep C screening -Tdap-possibly on 03/08/2021 with [facility] [place]  [place]   -HPV-hold vaccine 2.Placed a referral to general surgeon for right inguinal hernia. If you do not hear about an appointment or  receive a message from MyChart in 2 weeks, please call the office.  Discussed about red flags to go to the hospital.  3. Ordered screening labs. Office will call with lab results.   Return in about 1 year (around 02/22/2024) for physical.   Zandra Abts, NP

## 2023-02-23 ENCOUNTER — Telehealth: Payer: Self-pay

## 2023-02-23 LAB — HIV ANTIBODY (ROUTINE TESTING W REFLEX): HIV 1&2 Ab, 4th Generation: NONREACTIVE

## 2023-02-23 LAB — HEPATITIS C ANTIBODY: Hepatitis C Ab: NONREACTIVE

## 2023-02-23 NOTE — Telephone Encounter (Signed)
Hep C and HIV are non reactive.

## 2023-02-23 NOTE — Telephone Encounter (Signed)
-----   Message from Alveria Apley, NP sent at 02/23/2023  8:27 AM EDT ----- Labs look great! No concerns.

## 2023-02-24 NOTE — Telephone Encounter (Signed)
Left another message to call back

## 2023-02-24 NOTE — Telephone Encounter (Signed)
Left vm

## 2023-02-25 NOTE — Telephone Encounter (Signed)
Pt answer and then lost connection Letter has been sent

## 2023-02-25 NOTE — Telephone Encounter (Signed)
Mailed letter °

## 2023-03-02 NOTE — Progress Notes (Deleted)
Patient ID: Paul Bean, male   DOB: 08-04-1996, 27 y.o.   MRN: [id number]  Chief Complaint:  ***  History of Present Illness Paul Bean is a 27 y.o. male with ***.  Past Medical History Past Medical History:  Diagnosis Date   Arrhythmia    History of chickenpox    Migraine    PSVT (paroxysmal supraventricular tachycardia)    WPW (Wolff-Parkinson-White syndrome)       Past Surgical History:  Procedure Laterality Date   NO PAST SURGERIES      Allergies  Allergen Reactions   Penicillins Other (See Comments)    As a child    No current outpatient medications on file.   No current facility-administered medications for this visit.    Family History Family History  Problem Relation Age of Onset   Cancer Maternal Grandfather         Pancreatic   Heart disease Paternal Grandmother    CAD Paternal Grandfather    Prostate cancer Paternal Grandfather       Social History Social History   Tobacco Use   Smoking status: Never   Smokeless tobacco: Never  Vaping Use   Vaping status: Never Used  Substance Use Topics   Alcohol use: No   Drug use: No        ROS   Physical Exam There were no vitals taken for this visit.   CONSTITUTIONAL: Well developed, and nourished, appropriately responsive and aware without distress. ***  EYES: Sclera non-icteric.   EARS, NOSE, MOUTH AND THROAT: Mask worn.  *** The oropharynx is clear. Oral mucosa is pink and moist.  Dentition: ***   Hearing is intact to voice.  NECK: Trachea is midline, and there is no jugular venous distension.  LYMPH NODES:  Lymph nodes in the neck are not appreciated. RESPIRATORY:  Lungs are clear, and breath sounds are equal bilaterally. *** Normal respiratory effort without pathologic use of accessory muscles. CARDIOVASCULAR: Heart is regular in rate and rhythm.  *** Well perfused.  GI: The abdomen is *** soft, nontender, and nondistended. There were no palpable masses. *** I did not appreciate  hepatosplenomegaly. There were normal bowel sounds.  *** GU: *** MUSCULOSKELETAL:  Symmetrical muscle tone appreciated in all four extremities.    SKIN: Skin turgor is normal. No pathologic skin lesions appreciated.  NEUROLOGIC:  Motor and sensation appear grossly normal.  Cranial nerves are grossly without defect. PSYCH:  Alert and oriented to person, place and time. Affect is appropriate for situation.  Data Reviewed I have personally reviewed what is currently available of the patient's imaging, recent labs and medical records.   Labs:     Latest Ref Rng & Units 02/22/2023   11:34 AM 08/05/2021    7:57 PM 03/06/2016    2:10 PM  CBC  WBC 4.0 - 10.5 K/uL 4.3  5.0  6.2   Hemoglobin 13.0 - 17.0 g/dL 82.9  56.2  13.0   Hematocrit 39.0 - 52.0 % 43.6  44.5  45.9   Platelets 150.0 - 400.0 K/uL 268.0  244  231       Latest Ref Rng & Units 02/22/2023   11:34 AM 08/05/2021    7:57 PM 03/06/2016    2:07 PM  CMP  Glucose 70 - 99 mg/dL 79  99  95   BUN 6 - 23 mg/dL 20  14  12    Creatinine 0.40 - 1.50 mg/dL 8.65  7.84  6.96   Sodium 135 -  145 mEq/L 140  139  137   Potassium 3.5 - 5.1 mEq/L 3.7  3.6  4.1   Chloride 96 - 112 mEq/L 104  106  104   CO2 19 - 32 mEq/L 27  25  27    Calcium 8.4 - 10.5 mg/dL 69.6  9.3  9.1   Total Protein 6.0 - 8.3 g/dL 7.6   7.3   Total Bilirubin 0.2 - 1.2 mg/dL 0.8   0.9   Alkaline Phos 39 - 117 U/L 69   63   AST 0 - 37 U/L 16   18   ALT 0 - 53 U/L 14   17    *** {Labs :18171}  Imaging: Radiological images reviewed:  *** Within last 24 hrs: No results found.  Assessment    *** Patient Active Problem List   Diagnosis Date Noted   Direct inguinal hernia 12/02/2016   Other viral warts 12/02/2016   PSVT (paroxysmal supraventricular tachycardia) 10/22/2015   WPW (Wolff-Parkinson-White syndrome) 10/22/2015    Plan    ***  Face-to-face time spent with the patient and accompanying care providers(if present) was *** minutes, with more than 50% of the  time spent counseling, educating, and coordinating care of the patient.    These notes generated with voice recognition software. I apologize for typographical errors.  [name] M.D., FACS 03/02/2023, 8:33 PM

## 2023-03-03 ENCOUNTER — Telehealth: Payer: Self-pay

## 2023-03-03 ENCOUNTER — Encounter: Payer: Self-pay | Admitting: Surgery

## 2023-03-03 ENCOUNTER — Ambulatory Visit: Payer: 59 | Admitting: Surgery

## 2023-03-03 VITALS — BP 107/72 | HR 77 | Temp 98.0°F | Ht 73.0 in | Wt 189.8 lb

## 2023-03-03 DIAGNOSIS — K4090 Unilateral inguinal hernia, without obstruction or gangrene, not specified as recurrent: Secondary | ICD-10-CM

## 2023-03-03 NOTE — Patient Instructions (Signed)
Our surgery scheduler Barbara will call you within 24-48 hours to get you scheduled. If you have not heard from her after 48 hours, please call our office. Have the blue sheet available when she calls to write down important information.   If you have any concerns or questions, please feel free to call our office.   Laparoscopic Inguinal Hernia Repair, Adult Laparoscopic inguinal hernia repair is a surgical procedure to repair a small, weak spot in the groin muscles that allows fat or intestines from inside the abdomen to bulge out (inguinal hernia). This procedure may be planned, or it may be an emergency procedure. During the procedure, tissue that has bulged out is moved back into place, and the opening in the groin muscles is repaired. This is done through three small incisions in the abdomen. A thin tube with a light and camera on the end (laparoscope) is used to help perform the procedure. Tell a health care provider about: Any allergies you have. All medicines you are taking, including vitamins, herbs, eye drops, creams, and over-the-counter medicines. Any problems you or family members have had with anesthetic medicines. Any blood disorders you have. Any surgeries you have had. Any medical conditions you have. Whether you are pregnant or may be pregnant. What are the risks? Generally, this is a safe procedure. However, problems may occur, including: Infection. Bleeding. Allergic reactions to medicines. Damage to nearby structures or organs. Testicle damage or long-term pain and swelling of the scrotum, in males. Inability to completely empty the bladder (urinary retention). Blood clots. A collection of fluid that builds up under the skin (seroma). The hernia coming back (recurrence). What happens before the procedure? Staying hydrated Follow instructions from your health care provider about hydration, which may include: Up to 2 hours before the procedure - you may continue to  drink clear liquids, such as water, clear fruit juice, black coffee, and plain tea.  Eating and drinking restrictions Follow instructions from your health care provider about eating and drinking, which may include: 8 hours before the procedure - stop eating heavy meals or foods, such as meat, fried foods, or fatty foods. 6 hours before the procedure - stop eating light meals or foods, such as toast or cereal. 6 hours before the procedure - stop drinking milk or drinks that contain milk. 2 hours before the procedure - stop drinking clear liquids. Medicines Ask your health care provider about: Changing or stopping your regular medicines. This is especially important if you are taking diabetes medicines or blood thinners. Taking medicines such as aspirin and ibuprofen. These medicines can thin your blood. Do not take these medicines unless your health care provider tells you to take them. Taking over-the-counter medicines, vitamins, herbs, and supplements. General instructions Do not use any products that contain nicotine or tobacco for at least 4 weeks before the procedure, if possible. These products include cigarettes, chewing tobacco, and vaping devices, such as e-cigarettes. If you need help quitting, ask your health care provider. Ask your health care provider: How your surgery site will be marked. What steps will be taken to help prevent infection. These steps may include: Removing hair at the surgery site. Washing skin with a germ-killing soap. Taking antibiotic medicine. Plan to have a responsible adult take you home from the hospital or clinic. Plan to have a responsible adult care for you for the time you are told after you leave the hospital or clinic. This is important. What happens during the procedure? An IV will   be inserted into one of your veins. You will be given one or more of the following: A medicine to help you relax (sedative). A medicine to make you fall asleep  (general anesthetic). Three small incisions will be made in your abdomen. Your abdomen will be inflated with carbon dioxide gas to make the surgical area easier to see. A laparoscope and surgical instruments will be inserted through the incisions. The laparoscope will send images of the inside of your abdomen to a monitor in the room. Tissue that is bulging through the hernia may be removed or moved back into place. The hernia opening will be closed with a sheet of surgical mesh. The surgical instruments and laparoscope will be removed. Your incisions will be closed with stitches (sutures) and adhesive strips. A bandage (dressing) will be placed over your incisions. The procedure may vary among health care providers and hospitals. What happens after the procedure? Your blood pressure, heart rate, breathing rate, and blood oxygen level will be monitored until you leave the hospital or clinic. You will be given pain medicine as needed. You may continue to receive medicines and fluids through an IV. The IV will be removed after you can drink fluids. You will be encouraged to get up and move around and to take deep breaths frequently. If you were given a sedative during the procedure, it can affect you for several hours. Do not drive or operate machinery until your health care provider says that it is safe. Summary Laparoscopic inguinal hernia repair is a surgical procedure to repair a small, weak spot in the groin muscles that allows fat or intestines from inside the abdomen to bulge out (inguinal hernia). This procedure is done through three small incisions in the abdomen. A thin tube with a light and camera on the end (laparoscope) is used to help perform the procedure. After the procedure, you will be encouraged to get up and move around and to take deep breaths frequently. This information is not intended to replace advice given to you by your health care provider. Make sure you discuss any  questions you have with your health care provider. Document Revised: 04/01/2020 Document Reviewed: 04/01/2020 Elsevier Patient Education  2024 [employer].

## 2023-03-03 NOTE — Telephone Encounter (Signed)
   Name: KEOLA HENINGER  DOB: 1995/10/16  MRN: [id number]  Primary Cardiologist: None  Chart reviewed as part of pre-operative protocol coverage. Because of Dalen J Ahuja's past medical history and time since last visit, he will require a follow-up in-office visit in order to better assess preoperative cardiovascular risk.  Pre-op covering staff: - Please schedule appointment and call patient to inform them. If patient already had an upcoming appointment within acceptable timeframe, please add "pre-op clearance" to the appointment notes so provider is aware. - Please contact requesting surgeon's office via preferred method (i.e, phone, fax) to inform them of need for appointment prior to surgery.    Joylene Grapes, NP  03/03/2023, 11:40 AM

## 2023-03-03 NOTE — Telephone Encounter (Signed)
Faxed cardiac clearance to Dr. Sharrell Ku at [phone].

## 2023-03-03 NOTE — Telephone Encounter (Signed)
   Pre-operative Risk Assessment    Patient Name: Paul Bean  DOB: 1996-04-06 MRN: [id number]      Request for Surgical Clearance    Procedure:   ROBOTIC RIGHT INGUINAL HERNIA REPAIR  Date of Surgery:  Clearance TBD                                 Surgeon:  DR. Quintella Baton Surgeon's Group or Practice Name:  [facility] Phone number:  [phone] Fax number:  [phone]   Type of Clearance Requested:   - Medical    Type of Anesthesia:  General    Additional requests/questions:    Wilhemina Cash   03/03/2023, 10:10 AM

## 2023-03-03 NOTE — Telephone Encounter (Signed)
Left message for the patient to contact the office to schedule an appointment for cardiac clearance.

## 2023-03-03 NOTE — Progress Notes (Signed)
Patient ID: Paul Bean, male   DOB: [birth year], 27 y.o.   MRN: [id number]  Chief Complaint: Right inguinal hernia  History of Present Illness Paul Bean is a 28 y.o. male with right inguinal hernia known to me since 2018.  He reports it is progressed through that time, he has pain at least twice a week, doing significantly physical labor.  Most recently noted it while moving a refrigerator.  Denies nausea, vomiting, fevers or chills.  He has elected that it is now time to get this fixed before it gets any worse.  He reports no remarkable episodes of cardiac issues, last saw his cardiologist over a year ago.  Past Medical History Past Medical History:  Diagnosis Date   Arrhythmia    History of chickenpox    Migraine    PSVT (paroxysmal supraventricular tachycardia)    WPW (Wolff-Parkinson-White syndrome)       Past Surgical History:  Procedure Laterality Date   NO PAST SURGERIES      Allergies  Allergen Reactions   Penicillins Other (See Comments)    As a child    No current outpatient medications on file.   No current facility-administered medications for this visit.    Family History Family History  Problem Relation Age of Onset   Cancer Maternal Grandfather         Pancreatic   Heart disease Paternal Grandmother    CAD Paternal Grandfather    Prostate cancer Paternal Grandfather       Social History Social History   Tobacco Use   Smoking status: Never   Smokeless tobacco: Never  Vaping Use   Vaping status: Never Used  Substance Use Topics   Alcohol use: No   Drug use: No        Review of Systems  Constitutional: Negative.   HENT: Negative.    Eyes: Negative.   Respiratory: Negative.    Cardiovascular: Negative.  Negative for chest pain and palpitations.  Gastrointestinal: Negative.   Genitourinary: Negative.   Skin: Negative.   Neurological: Negative.   Psychiatric/Behavioral: Negative.       Physical Exam Blood pressure 107/72,  pulse 77, temperature 98 F (36.7 C), temperature source Oral, height 6\' 1"  (1.854 m), weight 189 lb 12.8 oz (86.1 kg), SpO2 99%. Last Weight  Most recent update: 03/03/2023  9:01 AM    Weight  86.1 kg (189 lb 12.8 oz)             CONSTITUTIONAL: Well developed, and nourished, appropriately responsive and aware without distress.   EYES: Sclera non-icteric.   EARS, NOSE, MOUTH AND THROAT:  The oropharynx is clear. Oral mucosa is pink and moist.     Hearing is intact to voice.  NECK: Trachea is midline, and there is no jugular venous distension.  LYMPH NODES:  Lymph nodes in the neck are not appreciated. RESPIRATORY:  Lungs are clear, and breath sounds are equal bilaterally.  Normal respiratory effort without pathologic use of accessory muscles. CARDIOVASCULAR: Heart is regular in rate and rhythm.   Well perfused.  GI: The abdomen is  soft, nontender, and nondistended. There were no palpable masses.   GU: Well-developed right inguinal hernia with progression to proximal scrotum.  Left side reveals a larger external inguinal ring than expected, but no appreciable hernia sac on Valsalva.  Testes descended. MUSCULOSKELETAL:  Symmetrical muscle tone appreciated in all four extremities.    SKIN: Skin turgor is normal. No pathologic skin lesions appreciated.  NEUROLOGIC:  Motor and sensation appear grossly normal.  Cranial nerves are grossly without defect. PSYCH:  Alert and oriented to person, place and time. Affect is appropriate for situation.  Data Reviewed I have personally reviewed what is currently available of the patient's imaging, recent labs and medical records.   Labs:     Latest Ref Rng & Units 02/22/2023   11:34 AM 08/05/2021    7:57 PM 03/06/2016    2:10 PM  CBC  WBC 4.0 - 10.5 K/uL 4.3  5.0  6.2   Hemoglobin 13.0 - 17.0 g/dL 91.4  78.2  95.6   Hematocrit 39.0 - 52.0 % 43.6  44.5  45.9   Platelets 150.0 - 400.0 K/uL 268.0  244  231       Latest Ref Rng & Units 02/22/2023    11:34 AM 08/05/2021    7:57 PM 03/06/2016    2:07 PM  CMP  Glucose 70 - 99 mg/dL 79  99  95   BUN 6 - 23 mg/dL 20  14  12    Creatinine 0.40 - 1.50 mg/dL 2.13  0.86  5.78   Sodium 135 - 145 mEq/L 140  139  137   Potassium 3.5 - 5.1 mEq/L 3.7  3.6  4.1   Chloride 96 - 112 mEq/L 104  106  104   CO2 19 - 32 mEq/L 27  25  27    Calcium 8.4 - 10.5 mg/dL 46.9  9.3  9.1   Total Protein 6.0 - 8.3 g/dL 7.6   7.3   Total Bilirubin 0.2 - 1.2 mg/dL 0.8   0.9   Alkaline Phos 39 - 117 U/L 69   63   AST 0 - 37 U/L 16   18   ALT 0 - 53 U/L 14   17       Imaging: Radiological images reviewed:   Within last 24 hrs: No results found.  Assessment    Right inguinal hernia, doubt bilateral. Patient Active Problem List   Diagnosis Date Noted   Direct inguinal hernia 12/02/2016   Other viral warts 12/02/2016   PSVT (paroxysmal supraventricular tachycardia) 10/22/2015   WPW (Wolff-Parkinson-White syndrome) 10/22/2015    Plan    Robotic right inguinal hernia repair, possible bilateral. Will defer scheduling procedure until after cardiology has given clearance regarding his Wolff-Parkinson-White syndrome.  * Cannot find OR case *   I discussed possibility of incarceration, strangulation, enlargement in size over time, and the need for emergency surgery in the face of these.  Also reviewed the techniques of reduction should incarceration occur, and when unsuccessful to present to the ED.  Also discussed that surgery risks include recurrence which can be up to 30% in the case of complex hernias, use of prosthetic materials (mesh) and the increased risk of infection and the possible need for re-operation and removal of mesh, possibility of post-op SBO or ileus, and the risks of general anesthetic including heart attack, stroke, sudden death or some reaction to anesthetic medications. The patient, and those present, appear to understand the risks, any and all questions were answered to the patient's  satisfaction.  No guarantees were ever expressed or implied.  Face-to-face time spent with the patient and accompanying care providers(if present) was 30 minutes, with more than 50% of the time spent counseling, educating, and coordinating care of the patient.    These notes generated with voice recognition software. I apologize for typographical errors.  [name] M.D., FACS 03/04/2023, 7:18 PM

## 2023-03-04 NOTE — Telephone Encounter (Signed)
Per EP scheduler the pt has been scheduled to see Dr. Ladona Ridgel 03/15/23 for pre op clearance. I will update all parties involved.

## 2023-03-04 NOTE — Telephone Encounter (Signed)
I will send message to EP Schedulers Cassie and Angeline to reach to the pt with an appt for pre op clearance.

## 2023-03-08 ENCOUNTER — Ambulatory Visit: Payer: 59 | Admitting: Internal Medicine

## 2023-03-15 ENCOUNTER — Ambulatory Visit: Payer: 59 | Attending: Internal Medicine | Admitting: Internal Medicine

## 2023-03-15 ENCOUNTER — Encounter: Payer: Self-pay | Admitting: Internal Medicine

## 2023-03-15 VITALS — BP 112/64 | HR 93 | Ht 74.0 in | Wt 191.8 lb

## 2023-03-15 DIAGNOSIS — I456 Pre-excitation syndrome: Secondary | ICD-10-CM

## 2023-03-15 NOTE — Patient Instructions (Signed)
Medication Instructions:  Your physician recommends that you continue on your current medications as directed. Please refer to the Current Medication list given to you today.  *If you need a refill on your cardiac medications before your next appointment, please call your pharmacy*  Follow-Up: At  [facility], you and your health needs are our priority.  As part of our continuing mission to provide you with exceptional heart care, we have created designated Provider Care Teams.  These Care Teams include your primary Cardiologist (physician) and Advanced Practice Providers (APPs -  Physician Assistants and Nurse Practitioners) who all work together to provide you with the care you need, when you need it.  Your next appointment:   1 year  Provider:   You may see Gregg Taylor, MD or one of the following Advanced Practice Providers on your designated Care Team:   Renee Ursuy, PA-C Michael "Andy" Tillery, PA-C Suzann Riddle, NP Brandi Ollis, NP    

## 2023-03-15 NOTE — Progress Notes (Signed)
HPI Mr. Paul Bean returns for followup. He is a pleasant 27 yo man with minimally symptomatic WPW syndrome. He has developed a hernia and is pending surgical repair. He denies any recent episodes of SVT. No syncope. No chest pain or sob.  Allergies  Allergen Reactions   Penicillins Other (See Comments)    As a child     No current outpatient medications on file.   No current facility-administered medications for this visit.     Past Medical History:  Diagnosis Date   Arrhythmia    History of chickenpox    Migraine    PSVT (paroxysmal supraventricular tachycardia)    WPW (Wolff-Parkinson-White syndrome)     ROS:   All systems reviewed and negative except as noted in the HPI.   Past Surgical History:  Procedure Laterality Date   NO PAST SURGERIES       Family History  Problem Relation Age of Onset   Cancer Maternal Grandfather         Pancreatic   Heart disease Paternal Grandmother    CAD Paternal Grandfather    Prostate cancer Paternal Grandfather      Social History   Socioeconomic History   Marital status: Single    Spouse name: Not on file   Number of children: 0   Years of education: 12   Highest education level: High school graduate  Occupational History   Occupation: Landscaping  Tobacco Use   Smoking status: Never   Smokeless tobacco: Never  Vaping Use   Vaping status: Never Used  Substance and Sexual Activity   Alcohol use: No   Drug use: No   Sexual activity: Never  Other Topics Concern   Not on file  Social History Narrative   Not on file   Social Determinants of Health   Financial Resource Strain: Low Risk  (02/22/2023)   Overall Financial Resource Strain (CARDIA)    Difficulty of Paying Living Expenses: Not hard at all  Food Insecurity: No Food Insecurity (02/22/2023)   Hunger Vital Sign    Worried About Running Out of Food in the Last Year: Never true    Ran Out of Food in the Last Year: Never true  Transportation Needs: No  Transportation Needs (02/22/2023)   PRAPARE - Administrator, Civil Service (Medical): No    Lack of Transportation (Non-Medical): No  Physical Activity: Inactive (02/22/2023)   Exercise Vital Sign    Days of Exercise per Week: 0 days    Minutes of Exercise per Session: 0 min  Stress: No Stress Concern Present (02/22/2023)   [employer] of Occupational Health - Occupational Stress Questionnaire    Feeling of Stress : Not at all  Social Connections: Moderately Isolated (02/22/2023)   Social Connection and Isolation Panel [NHANES]    Frequency of Communication with Friends and Family: More than three times a week    Frequency of Social Gatherings with Friends and Family: Twice a week    Attends Religious Services: 1 to 4 times per year    Active Member of [employer] or Organizations: No    Attends Banker Meetings: Never    Marital Status: Never married  Intimate Partner Violence: Not At Risk (02/22/2023)   Humiliation, Afraid, Rape, and Kick questionnaire    Fear of Current or Ex-Partner: No    Emotionally Abused: No    Physically Abused: No    Sexually Abused: No     BP 112/64  Pulse 93   Ht 6\' 2"  (1.88 m)   Wt 191 lb 12.8 oz (87 kg)   SpO2 96%   BMI 24.63 kg/m   Physical Exam:  Well appearing NAD HEENT: Unremarkable Neck:  No JVD, no thyromegally Lymphatics:  No adenopathy Back:  No CVA tenderness Lungs:  Clear with no wheezes HEART:  Regular rate rhythm, no murmurs, no rubs, no clicks Abd:  soft, positive bowel sounds, no organomegally, no rebound, no guarding Ext:  2 plus pulses, no edema, no cyanosis, no clubbing Skin:  No rashes no nodules Neuro:  CN II through XII intact, motor grossly intact  EKG - nsr with WPW syndrome.   A/P Preop eval - he is pending hernia repair. He is low risk for major cardiac complications. See below. WPW syndrome - he has been mostly asymptomatic. I warned him that there is a very small risk. About 08/998 of  developing SVT with induction of anesthesia or during the procedure. However, I think that he will do fine. Post hernia repair, I have encouraged him to consider catheter ablation.   Sharlot Gowda Azra Abrell,MD

## 2023-03-16 ENCOUNTER — Telehealth: Payer: Self-pay | Admitting: Surgery

## 2023-03-16 NOTE — Telephone Encounter (Signed)
Left message for patient to call. Checking on status whether or not he has decided to proceed with right inguinal hernia surgery?

## 2023-03-17 DIAGNOSIS — K4090 Unilateral inguinal hernia, without obstruction or gangrene, not specified as recurrent: Secondary | ICD-10-CM

## 2023-03-17 HISTORY — DX: Unilateral inguinal hernia, without obstruction or gangrene, not specified as recurrent: K40.90

## 2023-03-22 ENCOUNTER — Telehealth: Payer: Self-pay | Admitting: Surgery

## 2023-03-22 ENCOUNTER — Encounter: Payer: 59 | Admitting: Surgery

## 2023-03-22 NOTE — Telephone Encounter (Signed)
Patient has been advised of Pre-Admission date/time, and Surgery date at [facility]  Surgery Date: 04/13/23 Preadmission Testing Date: 04/05/23 (phone 8a-1p)  Patient has been made aware to call [phone], between 1-3:00pm the day before surgery, to find out what time to arrive for surgery.

## 2023-04-04 ENCOUNTER — Ambulatory Visit: Payer: Self-pay | Admitting: Surgery

## 2023-04-04 DIAGNOSIS — K4090 Unilateral inguinal hernia, without obstruction or gangrene, not specified as recurrent: Secondary | ICD-10-CM

## 2023-04-05 ENCOUNTER — Telehealth: Payer: Self-pay | Admitting: Internal Medicine

## 2023-04-05 ENCOUNTER — Telehealth: Payer: Self-pay

## 2023-04-05 ENCOUNTER — Encounter
Admission: RE | Admit: 2023-04-05 | Discharge: 2023-04-05 | Disposition: A | Discharge status: Home / Self Care | Payer: 59 | Source: Ambulatory Visit | Attending: Surgery | Admitting: Surgery

## 2023-04-05 NOTE — Telephone Encounter (Signed)
Received cardiac clearance from Dr. Ladona Ridgel. Pt's risk assessment is low and is optimized for surgery.   See notes in Epic.

## 2023-04-05 NOTE — Telephone Encounter (Signed)
   Pre-operative Risk Assessment    Patient Name: Paul Bean  DOB: [birth year] MRN: [id number]   Formal clearance was never entered but information as obtained from requesting office. Pt was cleared for hernia repair by Dr. Lewayne Bunting on 03/15/23. Will fax clearance to requesting office   Request for Surgical Clearance    Procedure:  Xi robotic Assisted inguinal hernia, possible left   Date of Surgery:  Clearance 04/13/23                                 Surgeon:  Dr. [name]  Surgeon's Group or Practice Name:  [facility]  Phone number:  [phone] Fax number:  [phone]   Type of Clearance Requested:   - Medical    Type of Anesthesia:  General    Additional requests/questions:    SignedVernard Gambles   04/05/2023, 3:10 PM

## 2023-04-05 NOTE — Patient Instructions (Addendum)
Your procedure is scheduled on: Wednesday, August 28 Report to the Registration Desk on the 1st floor of the [facility] To find out your arrival time, please call [phone] between 1PM - 3PM on: Tuesday, August 27 If your arrival time is 6:00 am, do not arrive before that time as the [facility] entrance doors do not open until 6:00 am.  REMEMBER: Instructions that are not followed completely may result in serious medical risk, up to and including death; or upon the discretion of your surgeon and anesthesiologist your surgery may need to be rescheduled.  Do not eat or drink after midnight the night before surgery.  No gum chewing or hard candies.  One week prior to surgery: starting August 21 Stop Anti-inflammatories (NSAIDS) such as Advil, Aleve, Ibuprofen, Motrin, Naproxen, Naprosyn and Aspirin based products such as Excedrin, Goody's Powder, BC Powder. Stop ANY OVER THE COUNTER supplements until after surgery. You may however, continue to take Tylenol if needed for pain up until the day of surgery.  DO NOT TAKE ANY MEDICATIONS THE MORNING OF SURGERY   No Alcohol for 24 hours before or after surgery.  No Smoking including e-cigarettes for 24 hours before surgery.  No chewable tobacco products for at least 6 hours before surgery.  No nicotine patches on the day of surgery.  Do not use any "recreational" drugs for at least a week (preferably 2 weeks) before your surgery.  Please be advised that the combination of cocaine and anesthesia may have negative outcomes, up to and including death. If you test positive for cocaine, your surgery will be cancelled.  On the morning of surgery brush your teeth with toothpaste and water, you may rinse your mouth with mouthwash if you wish. Do not swallow any toothpaste or mouthwash.  Use CHG Soap as directed on instruction sheet.  Do not wear jewelry, make-up, hairpins, clips or nail polish.  Do not wear lotions, powders, or perfumes.    Do not shave body hair from the neck down 48 hours before surgery.  Contact lenses, hearing aids and dentures may not be worn into surgery.  Do not bring valuables to the hospital. [facility] is not responsible for any missing/lost belongings or valuables.   Notify your doctor if there is any change in your medical condition (cold, fever, infection).  Wear comfortable clothing (specific to your surgery type) to the hospital.  After surgery, you can help prevent lung complications by doing breathing exercises.  Take deep breaths and cough every 1-2 hours. Your doctor may order a device called an Incentive Spirometer to help you take deep breaths. When coughing or sneezing, hold a pillow firmly against your incision with both hands. This is called "splinting." Doing this helps protect your incision. It also decreases belly discomfort.  If you are being discharged the day of surgery, you will not be allowed to drive home. You will need a responsible individual to drive you home and stay with you for 24 hours after surgery.   If you are taking public transportation, you will need to have a responsible individual with you.  Please call the Pre-admissions Testing Dept. at [phone] if you have any questions about these instructions.  Surgery Visitation Policy:  Patients having surgery or a procedure may have two visitors.  Children under the age of 39 must have an adult with them who is not the patient.     Preparing for Surgery with CHLORHEXIDINE GLUCONATE (CHG) Soap  Chlorhexidine Gluconate (CHG)  Soap  o An antiseptic cleaner that kills germs and bonds with the skin to continue killing germs even after washing  o Used for showering the night before surgery and morning of surgery  Before surgery, you can play an important role by reducing the number of germs on your skin.  CHG (Chlorhexidine gluconate) soap is an antiseptic cleanser which kills germs and bonds with the skin  to continue killing germs even after washing.  Please do not use if you have an allergy to CHG or antibacterial soaps. If your skin becomes reddened/irritated stop using the CHG.  1. Shower the NIGHT BEFORE SURGERY and the MORNING OF SURGERY with CHG soap.  2. If you choose to wash your hair, wash your hair first as usual with your normal shampoo.  3. After shampooing, rinse your hair and body thoroughly to remove the shampoo.  4. Use CHG as you would any other liquid soap. You can apply CHG directly to the skin and wash gently with a scrungie or a clean washcloth.  5. Apply the CHG soap to your body only from the neck down. Do not use on open wounds or open sores. Avoid contact with your eyes, ears, mouth, and genitals (private parts). Wash face and genitals (private parts) with your normal soap.  6. Wash thoroughly, paying special attention to the area where your surgery will be performed.  7. Thoroughly rinse your body with warm water.  8. Do not shower/wash with your normal soap after using and rinsing off the CHG soap.  9. Pat yourself dry with a clean towel.  10. Wear clean pajamas to bed the night before surgery.  12. Place clean sheets on your bed the night of your first shower and do not sleep with pets.  13. Shower again with the CHG soap on the day of surgery prior to arriving at the hospital.  14. Do not apply any deodorants/lotions/powders.  15. Please wear clean clothes to the hospital.

## 2023-04-05 NOTE — Telephone Encounter (Signed)
Marylene Land from [facility] was calling to follow up on the patient's clearance. Marylene Land requested we fax the clearance to [phone].

## 2023-04-07 ENCOUNTER — Encounter: Payer: Self-pay | Admitting: Surgery

## 2023-04-07 NOTE — Progress Notes (Signed)
Perioperative / Anesthesia Services  Pre-Admission Testing Clinical Review / Preoperative Anesthesia Consult  Date: 04/07/23  Patient Demographics:  Name: Paul Bean DOB:   10-17-1995 MRN:   [id number]  Planned Surgical Procedure(s):    Case: [id number] Date/Time: 04/13/23 0945   Procedure: XI ROBOTIC ASSISTED INGUINAL HERNIA, possible left (Right)   Anesthesia type: General   Pre-op diagnosis: inguinal hernia   Location: [facility] OR ROOM 04 / [facility]   Surgeons: [name], MD     NOTE: Available PAT nursing documentation and vital signs have been reviewed. Clinical nursing staff has updated patient's PMH/PSHx, current medication list, and drug allergies/intolerances to ensure comprehensive history available to assist in medical decision making as it pertains to the aforementioned surgical procedure and anticipated anesthetic course. Extensive review of available clinical information personally performed. [place] PMH and PSHx updated with any diagnoses/procedures that  may have been inadvertently omitted during his intake with the pre-admission testing department's nursing staff.  Clinical Discussion:  Paul Bean is a 27 y.o. male who is submitted for pre-surgical anesthesia review and clearance prior to him undergoing the above procedure. Patient has never been a smoker.. Pertinent PMH includes: WPW, PSVT, migraines, RIGHT inguinal hernia.  Patient is followed by cardiology Ladona Ridgel, MD). He was last seen in the cardiology clinic on 03/15/2023; notes reviewed. At the time of his clinic visit, patient doing well overall from a cardiovascular perspective. Patient denied any chest pain, shortness of breath, PND, orthopnea, palpitations, significant peripheral edema, weakness, fatigue, vertiginous symptoms, or presyncope/syncope. Patient with a past medical history significant for cardiovascular diagnoses. Documented physical exam was grossly benign,  providing no evidence of acute exacerbation and/or decompensation of the patient's known cardiovascular conditions.  Most recent TTE was performed on 10/21/2015 revealing a normal left ventricular systolic function with an EF of 55%.  There were no regional wall motion abnormalities.  PFO probably present.  Trace TR noted.  IVC dilated with respiratory variation, which is suggestive of possible elevated RAP.  All transvalvular gradients were noted to be normal providing no evidence suggestive of valvular stenosis.  Aorta normal in size with no evidence of aneurysmal dilatation.  Blood pressure well controlled at 112/64 mmHg without the use of pharmacological intervention.  Minimally symptomatic from his WPW.  No antihyperlipidemics.  Patient is not diabetic.  He does not have an OSAH diagnosis.  Patient active at baseline.  He is able to complete all of his ADLs/IADLs independently without cardiovascular limitation.  Per the [facility], patient able to exceed 4 METS of physical activity without experiencing angina/anginal equivalent symptoms. Patient is not taking any prescription or OTC medications at this time.  Patient being seen prior to elective surgery for risk stratification given his cardiovascular diagnoses. Cardiology recommending that patient consider catheter ablation after he has recovered from surgery.  Patient to follow-up with outpatient cardiology in 1 year or sooner if needed.  Paul Bean is scheduled for an XI ROBOTIC ASSISTED INGUINAL HERNIA, possible left (Right) on 04/13/2023 with Dr. [name], MD.  Given patient's past medical history significant for cardiovascular diagnoses, presurgical cardiac clearance was sought by the PAT team.  Per cardiology, "patient with a very small risk (approximately 08/998) of developing SVT with induction of anesthesia or during the procedure.  I think he will do fine.  Post hernia repair, patient has been encouraged to consider catheter ablation.   Patient cleared to proceed at an overall low risk for major cardiac complications".  In review of his medication reconciliation, the patient is not noted to be taking any type of anticoagulation or antiplatelet therapies that would need to be held during his perioperative course.  Patient denies previous perioperative complications with anesthesia in the past.  In review his EMR, there are no records available for review pertaining to past procedural/anesthetic courses within the [facility] system.     04/05/2023    9:19 AM 03/15/2023    9:11 AM 03/03/2023    9:01 AM  Vitals with BMI  Height 6\' 2"  6\' 2"  6\' 1"   Weight 186 lbs 191 lbs 13 oz 189 lbs 13 oz  BMI 23.87 24.62 25.05  Systolic  112 107  Diastolic  64 72  Pulse  93 77    Providers/Specialists:   NOTE: Primary physician provider listed below. Patient may have been seen by APP or partner within same practice.   PROVIDER ROLE / SPECIALTY LAST Barton Fanny, MD General Surgery (Surgeon) 03/03/2023  Alveria Apley, NP Primary Care Provider 02/22/2023  Lewayne Bunting, MD Cardiology 03/15/2023   Allergies:  Penicillins  Current Home Medications:   No current facility-administered medications for this encounter.   No current outpatient medications on file.   History:   Past Medical History:  Diagnosis Date   History of chickenpox    History of echocardiogram    a.) TTE : EF >55%, no RWMAs, ? PFO present, triv TR   Inguinal hernia, right 03/2023   Migraine    PSVT (paroxysmal supraventricular tachycardia)    WPW (Wolff-Parkinson-White syndrome)    Past Surgical History:  Procedure Laterality Date   NO PAST SURGERIES     Family History  Problem Relation Age of Onset   Cancer Maternal Grandfather         Pancreatic   Heart disease Paternal Grandmother    CAD Paternal Grandfather    Prostate cancer Paternal Grandfather    Social History   Tobacco Use   Smoking status: Never   Smokeless tobacco:  Never  Vaping Use   Vaping status: Never Used  Substance Use Topics   Alcohol use: No   Drug use: No    Pertinent Clinical Results:  LABS:   Lab Results  Component Value Date   WBC 4.3 02/22/2023   HGB 14.7 02/22/2023   HCT 43.6 02/22/2023   MCV 90.3 02/22/2023   PLT 268.0 02/22/2023   Lab Results  Component Value Date   NA 140 02/22/2023   K 3.7 02/22/2023   CO2 27 02/22/2023   GLUCOSE 79 02/22/2023   BUN 20 02/22/2023   CREATININE 0.91 02/22/2023   CALCIUM 10.0 02/22/2023   GFR 116.64 02/22/2023   GFRNONAA >60 08/05/2021    ECG: Date: 03/15/2023 Time ECG obtained: 0909 AM Rate: 93 bpm Rhythm: Normal sinus rhythm with WPW pattern Axis (leads I and aVF): Normal Intervals: PR 92 ms. QRS 142 ms. QTc 479 ms. ST segment and T wave changes: No evidence of acute ST segment elevation or depression; WPW type a pattern Comparison: Similar to previous tracing obtained on 08/05/2021   IMAGING / PROCEDURES: TRANSTHORACIC ECHOCARDIOGRAM performed on 10/21/2015 Normal left ventricular systolic function with an EF of 55%. No regional wall motion abnormalities A patent foramen ovale is probably present Trivial tricuspid valve regurgitation IVC is dilated with respiratory variation.  May suggest elevated right atrial pressure. No evidence of pulmonary hypertension Normal gradients; no valvular stenosis  Impression and Plan:  Paul Bean has been  referred for pre-anesthesia review and clearance prior to him undergoing the planned anesthetic and procedural courses. Available labs, pertinent testing, and imaging results were personally reviewed by me in preparation for upcoming operative/procedural course. [facility] medical record has been updated following extensive record review and patient interview with PAT staff.   This patient has been appropriately cleared by cardiology with an overall LOW risk of experiencing significant perioperative cardiovascular complications.  Based on clinical review performed today (04/07/23), barring any significant acute changes in the patient's overall condition, it is anticipated that he will be able to proceed with the planned surgical intervention. Any acute changes in clinical condition may necessitate his procedure being postponed and/or cancelled. Patient will meet with anesthesia team (MD and/or CRNA) on the day of his procedure for preoperative evaluation/assessment. Questions regarding anesthetic course will be fielded at that time.   Pre-surgical instructions were reviewed with the patient during his PAT appointment, and questions were fielded to satisfaction by PAT clinical staff. He has been instructed on which medications that he will need to hold prior to surgery, as well as the ones that have been deemed safe/appropriate to take on the day of his procedure. As part of the general education provided by PAT, patient made aware both verbally and in writing, that he would need to abstain from the use of any illegal substances during his perioperative course.  He was advised that failure to follow the provided instructions could necessitate case cancellation or result in serious perioperative complications up to and including death. Patient encouraged to contact PAT and/or his surgeon's office to discuss any questions or concerns that may arise prior to surgery; verbalized understanding.   Quentin Mulling, MSN, APRN, FNP-C, CEN [facility]  Peri-operative Services Nurse Practitioner Phone: [phone] Fax: [phone] 04/07/23 1:44 PM  NOTE: This note has been prepared using Dragon dictation software. Despite my best ability to proofread, there is always the potential that unintentional transcriptional errors may still occur from this process.

## 2023-04-12 MED ORDER — ORAL CARE MOUTH RINSE: 15.0000 mL | Freq: Once | OROMUCOSAL | Status: AC

## 2023-04-12 MED ORDER — FAMOTIDINE 20 MG PO TABS
20.0000 mg | ORAL_TABLET | Freq: Once | ORAL | Status: AC
  Administered 2023-04-13: 20 mg via ORAL

## 2023-04-12 MED ORDER — GABAPENTIN 300 MG PO CAPS
300.0000 mg | ORAL_CAPSULE | ORAL | Status: AC
  Administered 2023-04-13: 300 mg via ORAL

## 2023-04-12 MED ORDER — CHLORHEXIDINE GLUCONATE 0.12 % MT SOLN
15.0000 mL | Freq: Once | OROMUCOSAL | Status: AC
  Administered 2023-04-13: 15 mL via OROMUCOSAL

## 2023-04-12 MED ORDER — BUPIVACAINE LIPOSOME 1.3 % IJ SUSP: 20.0000 mL | Freq: Once | INTRAMUSCULAR | Status: DC

## 2023-04-12 MED ORDER — LACTATED RINGERS IV SOLN: INTRAVENOUS | Status: DC

## 2023-04-12 MED ORDER — CELECOXIB 200 MG PO CAPS
200.0000 mg | ORAL_CAPSULE | ORAL | Status: AC
  Administered 2023-04-13: 200 mg via ORAL

## 2023-04-12 MED ORDER — CEFAZOLIN SODIUM-DEXTROSE 2-4 GM/100ML-% IV SOLN
2.0000 g | INTRAVENOUS | Status: AC
  Administered 2023-04-13: 2 g via INTRAVENOUS

## 2023-04-12 MED ORDER — ACETAMINOPHEN 500 MG PO TABS
1000.0000 mg | ORAL_TABLET | ORAL | Status: AC
  Administered 2023-04-13: 1000 mg via ORAL

## 2023-04-12 MED ORDER — CHLORHEXIDINE GLUCONATE CLOTH 2 % EX PADS: 6.0000 | MEDICATED_PAD | Freq: Once | CUTANEOUS | Status: DC

## 2023-04-13 ENCOUNTER — Ambulatory Visit: Payer: 59 | Admitting: Urgent Care

## 2023-04-13 ENCOUNTER — Encounter
Admission: RE | Disposition: A | Discharge status: Home / Self Care | Payer: Self-pay | Source: Home / Self Care | Attending: Surgery

## 2023-04-13 ENCOUNTER — Other Ambulatory Visit: Payer: Self-pay

## 2023-04-13 ENCOUNTER — Encounter: Payer: Self-pay | Admitting: Surgery

## 2023-04-13 ENCOUNTER — Ambulatory Visit
Admission: RE | Admit: 2023-04-13 | Discharge: 2023-04-13 | Disposition: A | Discharge status: Home / Self Care | Payer: 59 | Attending: Surgery | Admitting: Surgery

## 2023-04-13 DIAGNOSIS — I456 Pre-excitation syndrome: Secondary | ICD-10-CM | POA: Diagnosis not present

## 2023-04-13 DIAGNOSIS — K4090 Unilateral inguinal hernia, without obstruction or gangrene, not specified as recurrent: Secondary | ICD-10-CM | POA: Diagnosis not present

## 2023-04-13 HISTORY — DX: Personal history of other medical treatment: Z92.89

## 2023-04-13 HISTORY — PX: INSERTION OF MESH: SHX5868

## 2023-04-13 SURGERY — HERNIORRHAPHY, INGUINAL, ROBOT-ASSISTED, LAPAROSCOPIC
Anesthesia: General | Site: Inguinal | Laterality: Right

## 2023-04-13 MED ORDER — ONDANSETRON HCL 4 MG/2ML IJ SOLN
INTRAMUSCULAR | Status: DC | PRN
Start: 2023-04-13 — End: 2023-04-13
  Administered 2023-04-13: 4 mg via INTRAVENOUS

## 2023-04-13 MED ORDER — SUGAMMADEX SODIUM 200 MG/2ML IV SOLN
INTRAVENOUS | Status: DC | PRN
  Administered 2023-04-13: 200 mg via INTRAVENOUS

## 2023-04-13 MED ORDER — CEFAZOLIN SODIUM-DEXTROSE 2-4 GM/100ML-% IV SOLN
INTRAVENOUS | Status: AC
  Filled 2023-04-13: qty 100

## 2023-04-13 MED ORDER — 0.9 % SODIUM CHLORIDE (POUR BTL) OPTIME
TOPICAL | Status: DC | PRN
  Administered 2023-04-13: 500 mL

## 2023-04-13 MED ORDER — FAMOTIDINE 20 MG PO TABS
ORAL_TABLET | ORAL | Status: AC
  Filled 2023-04-13: qty 1

## 2023-04-13 MED ORDER — MIDAZOLAM HCL 2 MG/2ML IJ SOLN
INTRAMUSCULAR | Status: DC | PRN
  Administered 2023-04-13 (×2): 2 mg via INTRAVENOUS

## 2023-04-13 MED ORDER — ROCURONIUM BROMIDE 10 MG/ML (PF) SYRINGE
PREFILLED_SYRINGE | INTRAVENOUS | Status: AC
  Filled 2023-04-13: qty 10

## 2023-04-13 MED ORDER — BUPIVACAINE-EPINEPHRINE (PF) 0.25% -1:200000 IJ SOLN
INTRAMUSCULAR | Status: AC
  Filled 2023-04-13: qty 30

## 2023-04-13 MED ORDER — FENTANYL CITRATE (PF) 100 MCG/2ML IJ SOLN
INTRAMUSCULAR | Status: AC
  Filled 2023-04-13: qty 2

## 2023-04-13 MED ORDER — GABAPENTIN 300 MG PO CAPS
ORAL_CAPSULE | ORAL | Status: AC
  Filled 2023-04-13: qty 1

## 2023-04-13 MED ORDER — CHLORHEXIDINE GLUCONATE 0.12 % MT SOLN
OROMUCOSAL | Status: AC
  Filled 2023-04-13: qty 15

## 2023-04-13 MED ORDER — MIDAZOLAM HCL 2 MG/2ML IJ SOLN
INTRAMUSCULAR | Status: AC
  Filled 2023-04-13: qty 2

## 2023-04-13 MED ORDER — DEXMEDETOMIDINE HCL IN NACL 80 MCG/20ML IV SOLN
INTRAVENOUS | Status: DC | PRN
Start: 2023-04-13 — End: 2023-04-13
  Administered 2023-04-13: 20 ug via INTRAVENOUS

## 2023-04-13 MED ORDER — ROCURONIUM BROMIDE 100 MG/10ML IV SOLN
INTRAVENOUS | Status: DC | PRN
  Administered 2023-04-13 (×2): 50 mg via INTRAVENOUS

## 2023-04-13 MED ORDER — FENTANYL CITRATE (PF) 100 MCG/2ML IJ SOLN
25.0000 ug | INTRAMUSCULAR | Status: DC | PRN
  Administered 2023-04-13: 50 ug via INTRAVENOUS

## 2023-04-13 MED ORDER — ONDANSETRON HCL 4 MG/2ML IJ SOLN
INTRAMUSCULAR | Status: AC
  Filled 2023-04-13: qty 2

## 2023-04-13 MED ORDER — IBUPROFEN 800 MG PO TABS: 800.0000 mg | ORAL_TABLET | Freq: Three times a day (TID) | ORAL | 0 refills | Status: DC | PRN

## 2023-04-13 MED ORDER — BUPIVACAINE LIPOSOME 1.3 % IJ SUSP
INTRAMUSCULAR | Status: DC | PRN
  Administered 2023-04-13: 20 mL

## 2023-04-13 MED ORDER — HYDROCODONE-ACETAMINOPHEN 5-325 MG PO TABS: 1.0000 | ORAL_TABLET | Freq: Four times a day (QID) | ORAL | 0 refills | Status: DC | PRN

## 2023-04-13 MED ORDER — BUPIVACAINE-EPINEPHRINE (PF) 0.25% -1:200000 IJ SOLN
INTRAMUSCULAR | Status: DC | PRN
  Administered 2023-04-13: 30 mL

## 2023-04-13 MED ORDER — FENTANYL CITRATE (PF) 100 MCG/2ML IJ SOLN
INTRAMUSCULAR | Status: DC | PRN
  Administered 2023-04-13: 50 ug via INTRAVENOUS

## 2023-04-13 MED ORDER — DROPERIDOL 2.5 MG/ML IJ SOLN: 0.6250 mg | Freq: Once | INTRAMUSCULAR | Status: DC | PRN

## 2023-04-13 MED ORDER — PROPOFOL 10 MG/ML IV BOLUS
INTRAVENOUS | Status: DC | PRN
Start: 2023-04-13 — End: 2023-04-13
  Administered 2023-04-13: 250 mg via INTRAVENOUS
  Administered 2023-04-13: 100 ug/kg/min via INTRAVENOUS

## 2023-04-13 MED ORDER — HYDROCODONE-ACETAMINOPHEN 5-325 MG PO TABS
ORAL_TABLET | ORAL | Status: AC
  Filled 2023-04-13: qty 1

## 2023-04-13 MED ORDER — CELECOXIB 200 MG PO CAPS
ORAL_CAPSULE | ORAL | Status: AC
  Filled 2023-04-13: qty 1

## 2023-04-13 MED ORDER — ACETAMINOPHEN 500 MG PO TABS
ORAL_TABLET | ORAL | Status: AC
  Filled 2023-04-13: qty 2

## 2023-04-13 MED ORDER — PROPOFOL 1000 MG/100ML IV EMUL
INTRAVENOUS | Status: AC
  Filled 2023-04-13: qty 100

## 2023-04-13 MED ORDER — DEXAMETHASONE SODIUM PHOSPHATE 10 MG/ML IJ SOLN
INTRAMUSCULAR | Status: DC | PRN
  Administered 2023-04-13: 10 mg via INTRAVENOUS

## 2023-04-13 MED ORDER — LIDOCAINE HCL (PF) 2 % IJ SOLN
INTRAMUSCULAR | Status: AC
  Filled 2023-04-13: qty 5

## 2023-04-13 MED ORDER — HYDROCODONE-ACETAMINOPHEN 5-325 MG PO TABS
1.0000 | ORAL_TABLET | Freq: Four times a day (QID) | ORAL | Status: DC | PRN
  Administered 2023-04-13: 1 via ORAL

## 2023-04-13 MED ORDER — LIDOCAINE HCL (CARDIAC) PF 100 MG/5ML IV SOSY
PREFILLED_SYRINGE | INTRAVENOUS | Status: DC | PRN
  Administered 2023-04-13: 100 mg via INTRAVENOUS

## 2023-04-13 MED ORDER — DEXAMETHASONE SODIUM PHOSPHATE 10 MG/ML IJ SOLN
INTRAMUSCULAR | Status: AC
  Filled 2023-04-13: qty 1

## 2023-04-13 MED ORDER — BUPIVACAINE LIPOSOME 1.3 % IJ SUSP
INTRAMUSCULAR | Status: AC
  Filled 2023-04-13: qty 20

## 2023-04-13 SURGICAL SUPPLY — 51 items
ADH SKN CLS APL DERMABOND .7 (GAUZE/BANDAGES/DRESSINGS) ×2
BLADE CLIPPER SURG (BLADE) ×2 IMPLANT
COVER TIP SHEARS 8 DVNC (MISCELLANEOUS) ×2 IMPLANT
COVER WAND RF STERILE (DRAPES) ×2 IMPLANT
DERMABOND ADVANCED .7 DNX12 (GAUZE/BANDAGES/DRESSINGS) ×2 IMPLANT
DRAPE ARM DVNC X/XI (DISPOSABLE) ×6 IMPLANT
DRAPE COLUMN DVNC XI (DISPOSABLE) ×2 IMPLANT
DRAPE UTILITY 15X26 TOWEL STRL (DRAPES) ×2 IMPLANT
ELECT REM PT RETURN 9FT ADLT (ELECTROSURGICAL) ×2
ELECTRODE REM PT RTRN 9FT ADLT (ELECTROSURGICAL) ×2 IMPLANT
FORCEPS BPLR R/ABLATION 8 DVNC (INSTRUMENTS) ×2 IMPLANT
GLOVE ORTHO TXT STRL SZ7.5 (GLOVE) ×6 IMPLANT
GOWN STRL REUS W/ TWL LRG LVL3 (GOWN DISPOSABLE) ×2 IMPLANT
GOWN STRL REUS W/ TWL XL LVL3 (GOWN DISPOSABLE) ×4 IMPLANT
GOWN STRL REUS W/TWL LRG LVL3 (GOWN DISPOSABLE) ×2
GOWN STRL REUS W/TWL XL LVL3 (GOWN DISPOSABLE) ×4
GRASPER SUT TROCAR 14GX15 (MISCELLANEOUS) IMPLANT
IRRIGATION STRYKERFLOW (MISCELLANEOUS) IMPLANT
IRRIGATOR STRYKERFLOW (MISCELLANEOUS)
IV NS 1000ML (IV SOLUTION)
IV NS 1000ML BAXH (IV SOLUTION) IMPLANT
KIT PINK PAD W/HEAD ARE REST (MISCELLANEOUS) ×2
KIT PINK PAD W/HEAD ARM REST (MISCELLANEOUS) ×2 IMPLANT
LABEL OR SOLS (LABEL) ×2 IMPLANT
MANIFOLD NEPTUNE II (INSTRUMENTS) ×2 IMPLANT
MESH 3DMAX LIGHT 4.8X6.7 RT XL (Mesh General) IMPLANT
NDL DRIVE SUT CUT DVNC (INSTRUMENTS) ×2 IMPLANT
NDL HYPO 22X1.5 SAFETY MO (MISCELLANEOUS) ×2 IMPLANT
NDL INSUFFLATION 14GA 120MM (NEEDLE) IMPLANT
NEEDLE DRIVE SUT CUT DVNC (INSTRUMENTS) ×2 IMPLANT
NEEDLE HYPO 22X1.5 SAFETY MO (MISCELLANEOUS) ×2 IMPLANT
NEEDLE INSUFFLATION 14GA 120MM (NEEDLE) IMPLANT
NS IRRIG 500ML POUR BTL (IV SOLUTION) IMPLANT
PACK LAP CHOLECYSTECTOMY (MISCELLANEOUS) ×2 IMPLANT
SCISSORS MNPLR CVD DVNC XI (INSTRUMENTS) ×2 IMPLANT
SEAL UNIV 5-12 XI (MISCELLANEOUS) ×6 IMPLANT
SET TUBE SMOKE EVAC HIGH FLOW (TUBING) ×2 IMPLANT
SOL ELECTROSURG ANTI STICK (MISCELLANEOUS) ×2
SOLUTION ELECTROSURG ANTI STCK (MISCELLANEOUS) ×2 IMPLANT
SUT MNCRL 4-0 (SUTURE) ×2
SUT MNCRL 4-0 27XMFL (SUTURE) ×2
SUT V-LOC 90 ABS 3-0 VLT V-20 (SUTURE) IMPLANT
SUT VIC AB 2-0 SH 27 (SUTURE) ×2
SUT VIC AB 2-0 SH 27XBRD (SUTURE) ×2 IMPLANT
SUT VIC AB 3-0 SH 27 (SUTURE) ×2
SUT VIC AB 3-0 SH 27X BRD (SUTURE) IMPLANT
SUT VICRYL 0 UR6 27IN ABS (SUTURE) IMPLANT
SUT VLOC 90 S/L VL9 GS22 (SUTURE) IMPLANT
SUTURE MNCRL 4-0 27XMF (SUTURE) ×2 IMPLANT
TRAP FLUID SMOKE EVACUATOR (MISCELLANEOUS) ×2 IMPLANT
WATER STERILE IRR 500ML POUR (IV SOLUTION) ×2 IMPLANT

## 2023-04-13 NOTE — Transfer of Care (Signed)
Immediate Anesthesia Transfer of Care Note  Patient: Paul Bean  Procedure(s) Performed: XI ROBOTIC ASSISTED INGUINAL HERNIA, possible left (Right) INSERTION OF MESH (Right: Inguinal)  Patient Location: PACU  Anesthesia Type:General  Level of Consciousness: drowsy and responds to stimulation  Airway & Oxygen Therapy: Patient Spontanous Breathing  Post-op Assessment: Report given to RN and Post -op Vital signs reviewed and stable  Post vital signs: Reviewed and stable  Last Vitals:  Vitals Value Taken Time  BP 104/64 04/13/23 1106  Temp 36.1 C 04/13/23 1106  Pulse 73 04/13/23 1113  Resp 23 04/13/23 1113  SpO2 100 % 04/13/23 1113  Vitals shown include unfiled device data.  Last Pain:  Vitals:   04/13/23 1106  TempSrc:   PainSc: 0-No pain         Complications: There were no known notable events for this encounter.

## 2023-04-13 NOTE — Addendum Note (Signed)
Addendum  created 04/13/23 1510 by Lysbeth Penner, CRNA   Flowsheet accepted

## 2023-04-13 NOTE — H&P (Signed)
Patient ID: Paul Bean, male   DOB: 06-Dec-1995, 27 y.o.   MRN: [id number]  Chief Complaint: Right inguinal hernia   History of Present Illness Paul Bean is a 27 y.o. male with right inguinal hernia known to me since 2018.  He reports it is progressed through that time, he has pain at least twice a week, doing significantly physical labor.  Most recently noted it while moving a refrigerator.  Denies nausea, vomiting, fevers or chills.  He has elected that it is now time to get this fixed before it gets any worse.  He reports no remarkable episodes of cardiac issues, last saw his cardiologist over a year ago.   Past Medical History Past Medical History:  Diagnosis Date   History of chickenpox    History of echocardiogram    a.) TTE 10/21/2015: EF >55%, no RWMAs, ? PFO present, triv TR   Inguinal hernia, right 03/2023   Migraine    PSVT (paroxysmal supraventricular tachycardia)    WPW (Wolff-Parkinson-White syndrome)       Past Surgical History:  Procedure Laterality Date   NO PAST SURGERIES      Allergies  Allergen Reactions   Penicillins Other (See Comments)    As a child (mother was allergic)    Current Facility-Administered Medications  Medication Dose Route Frequency Provider Last Rate Last Admin   acetaminophen (TYLENOL) tablet 1,000 mg  1,000 mg Oral On Call to OR [name], MD       bupivacaine liposome (EXPAREL) 1.3 % injection 266 mg  20 mL Infiltration Once [name], MD       ceFAZolin (ANCEF) IVPB 2g/100 mL premix  2 g Intravenous On Call to OR [name], MD       celecoxib (CELEBREX) capsule 200 mg  200 mg Oral On Call to OR [name], MD       chlorhexidine (PERIDEX) 0.12 % solution 15 mL  15 mL Mouth/Throat Once Stephanie Coup, MD       Or   Oral care mouth rinse  15 mL Mouth Rinse Once Stephanie Coup, MD       Chlorhexidine Gluconate Cloth 2 % PADS 6 each  6 each Topical Once [name], MD       And   Chlorhexidine  Gluconate Cloth 2 % PADS 6 each  6 each Topical Once [name], MD       famotidine (PEPCID) tablet 20 mg  20 mg Oral Once Verlee Monte, NP       gabapentin (NEURONTIN) capsule 300 mg  300 mg Oral On Call to OR [name], MD       lactated ringers infusion   Intravenous Continuous Stephanie Coup, MD        Family History Family History  Problem Relation Age of Onset   Cancer Maternal Grandfather         Pancreatic   Heart disease Paternal Grandmother    CAD Paternal Grandfather    Prostate cancer Paternal Grandfather       Social History Social History   Tobacco Use   Smoking status: Never   Smokeless tobacco: Never  Vaping Use   Vaping status: Never Used  Substance Use Topics   Alcohol use: No   Drug use: No        ROS Review of Systems  Constitutional: Negative.   HENT: Negative.    Eyes: Negative.   Respiratory: Negative.    Cardiovascular: Negative.  Negative for  chest pain and palpitations.  Gastrointestinal: Negative.   Genitourinary: Negative.   Skin: Negative.   Neurological: Negative.   Psychiatric/Behavioral: Negative.   Physical Exam Blood pressure 117/79, temperature 97.8 F (36.6 C), temperature source Oral, height 6\' 2"  (1.88 m), weight 84.4 kg, SpO2 99%. Last Weight  Most recent update: 04/13/2023  8:55 AM    Weight  84.4 kg (185 lb 15.7 oz)            CONSTITUTIONAL: Well developed, and nourished, appropriately responsive and aware without distress.   EYES: Sclera non-icteric.   EARS, NOSE, MOUTH AND THROAT:  The oropharynx is clear. Oral mucosa is pink and moist.     Hearing is intact to voice.  NECK: Trachea is midline, and there is no jugular venous distension.  LYMPH NODES:  Lymph nodes in the neck are not appreciated. RESPIRATORY:  Lungs are clear, and breath sounds are equal bilaterally.  Normal respiratory effort without pathologic use of accessory muscles. CARDIOVASCULAR: Heart is regular in rate and rhythm.   Well  perfused.  GI: The abdomen is  soft, nontender, and nondistended. There were no palpable masses.   GU: Well-developed right inguinal hernia with progression to proximal scrotum.  Left side reveals a larger external inguinal ring than expected, but no appreciable hernia sac on Valsalva.  Testes descended. MUSCULOSKELETAL:  Symmetrical muscle tone appreciated in all four extremities.    SKIN: Skin turgor is normal. No pathologic skin lesions appreciated.  NEUROLOGIC:  Motor and sensation appear grossly normal.  Cranial nerves are grossly without defect. PSYCH:  Alert and oriented to person, place and time. Affect is appropriate for situation.  Data Reviewed I have personally reviewed what is currently available of the patient's imaging, recent labs and medical records.   Labs:     Latest Ref Rng & Units 02/22/2023   11:34 AM 08/05/2021    7:57 PM 03/06/2016    2:10 PM  CBC  WBC 4.0 - 10.5 K/uL 4.3  5.0  6.2   Hemoglobin 13.0 - 17.0 g/dL 82.9  56.2  13.0   Hematocrit 39.0 - 52.0 % 43.6  44.5  45.9   Platelets 150.0 - 400.0 K/uL 268.0  244  231       Latest Ref Rng & Units 02/22/2023   11:34 AM 08/05/2021    7:57 PM 03/06/2016    2:07 PM  CMP  Glucose 70 - 99 mg/dL 79  99  95   BUN 6 - 23 mg/dL 20  14  12    Creatinine 0.40 - 1.50 mg/dL 8.65  7.84  6.96   Sodium 135 - 145 mEq/L 140  139  137   Potassium 3.5 - 5.1 mEq/L 3.7  3.6  4.1   Chloride 96 - 112 mEq/L 104  106  104   CO2 19 - 32 mEq/L 27  25  27    Calcium 8.4 - 10.5 mg/dL 29.5  9.3  9.1   Total Protein 6.0 - 8.3 g/dL 7.6   7.3   Total Bilirubin 0.2 - 1.2 mg/dL 0.8   0.9   Alkaline Phos 39 - 117 U/L 69   63   AST 0 - 37 U/L 16   18   ALT 0 - 53 U/L 14   17     Imaging: Radiological images reviewed:   Within last 24 hrs: No results found.  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  Patient Active Problem List   Diagnosis Date Noted  Right inguinal hernia 12/02/2016   Other viral warts 12/02/2016   PSVT (paroxysmal  supraventricular tachycardia) 10/22/2015   WPW (Wolff-Parkinson-White syndrome) 10/22/2015    Plan      XI ROBOTIC ASSISTED INGUINAL HERNIA, possible left: 49650 (CPT) Should read right, possible bilateral.   Robotic right inguinal hernia repair, possible bilateral.   I discussed possibility of incarceration, strangulation, enlargement in size over time, and the need for emergency surgery in the face of these.  Also reviewed the techniques of reduction should incarceration occur, and when unsuccessful to present to the ED.  Also discussed that surgery risks include recurrence which can be up to 30% in the case of complex hernias, use of prosthetic materials (mesh) and the increased risk of infection and the possible need for re-operation and removal of mesh, possibility of post-op SBO or ileus, and the risks of general anesthetic including heart attack, stroke, sudden death or some reaction to anesthetic medications. The patient, and those present, appear to understand the risks, any and all questions were answered to the patient's satisfaction.  No guarantees were ever expressed or implied.   These notes generated with voice recognition software. I apologize for typographical errors.  [name] M.D., FACS 04/13/2023, 9:17 AM

## 2023-04-13 NOTE — Addendum Note (Signed)
Addendum  created 04/13/23 1655 by Lysbeth Penner, CRNA   Intraprocedure Flowsheets edited, Intraprocedure Meds edited

## 2023-04-13 NOTE — Anesthesia Postprocedure Evaluation (Signed)
Anesthesia Post Note  Patient: Paul Bean  Procedure(s) Performed: XI ROBOTIC ASSISTED INGUINAL HERNIA, possible left (Right) INSERTION OF MESH (Right: Inguinal)  Patient location during evaluation: PACU Anesthesia Type: General Level of consciousness: awake and alert Pain management: pain level controlled Vital Signs Assessment: post-procedure vital signs reviewed and stable Respiratory status: spontaneous breathing, nonlabored ventilation, respiratory function stable and patient connected to nasal cannula oxygen Cardiovascular status: blood pressure returned to baseline and stable Postop Assessment: no apparent nausea or vomiting Anesthetic complications: no   There were no known notable events for this encounter.   Last Vitals:  Vitals:   04/13/23 1200 04/13/23 1223  BP: 112/67 103/69  Pulse: 66 71  Resp: (!) 25 20  Temp: 37.1 C 36.9 C  SpO2: 100% 99%    Last Pain:  Vitals:   04/13/23 1223  TempSrc: Temporal  PainSc: 0-No pain                 Lenard Simmer

## 2023-04-13 NOTE — Anesthesia Procedure Notes (Signed)
Procedure Name: Intubation Date/Time: 04/13/2023 9:48 AM  Performed by: Lysbeth Penner, CRNAPre-anesthesia Checklist: Patient identified, Emergency Drugs available, Suction available and Patient being monitored Patient Re-evaluated:Patient Re-evaluated prior to induction Oxygen Delivery Method: Circle system utilized Preoxygenation: Pre-oxygenation with 100% oxygen Induction Type: IV induction Ventilation: Mask ventilation without difficulty Laryngoscope Size: McGraph and 3 Grade View: Grade I Tube type: Oral Tube size: 7.0 mm Number of attempts: 1 Airway Equipment and Method: Stylet and Oral airway Placement Confirmation: ETT inserted through vocal cords under direct vision, positive ETCO2 and breath sounds checked- equal and bilateral Secured at: 22 cm Tube secured with: Tape Dental Injury: Teeth and Oropharynx as per pre-operative assessment

## 2023-04-13 NOTE — Anesthesia Preprocedure Evaluation (Signed)
Anesthesia Evaluation  Patient identified by MRN, date of birth, ID band Patient awake    Reviewed: Allergy & Precautions, H&P , NPO status , Patient's Chart, lab work & pertinent test results, reviewed documented beta blocker date and time   History of Anesthesia Complications Negative for: history of anesthetic complications  Airway Mallampati: I  TM Distance: >3 FB Neck ROM: full    Dental  (+) Dental Advidsory Given, Teeth Intact   Pulmonary neg pulmonary ROS, Continuous Positive Airway Pressure Ventilation    Pulmonary exam normal breath sounds clear to auscultation       Cardiovascular Exercise Tolerance: Good (-) hypertension(-) angina (-) Past MI, (-) Cardiac Stents and (-) CABG + dysrhythmias (WPW) Supra Ventricular Tachycardia (-) Valvular Problems/Murmurs Rhythm:regular Rate:Normal     Neuro/Psych negative neurological ROS  negative psych ROS   GI/Hepatic negative GI ROS, Neg liver ROS,,,  Endo/Other  negative endocrine ROS    Renal/GU negative Renal ROS  negative genitourinary   Musculoskeletal   Abdominal   Peds  Hematology negative hematology ROS (+)   Anesthesia Other Findings Past Medical History: No date: History of chickenpox No date: History of echocardiogram     Comment:  a.) TTE 10/21/2015: EF >55%, no RWMAs, ? PFO present,               triv TR 03/2023: Inguinal hernia, right No date: Migraine No date: PSVT (paroxysmal supraventricular tachycardia) No date: WPW (Wolff-Parkinson-White syndrome)   Reproductive/Obstetrics negative OB ROS                             Anesthesia Physical Anesthesia Plan  ASA: 2  Anesthesia Plan: General   Post-op Pain Management:    Induction: Intravenous  PONV Risk Score and Plan: 2 and Ondansetron, Dexamethasone, Midazolam and Treatment may vary due to age or medical condition  Airway Management Planned: Oral  ETT  Additional Equipment:   Intra-op Plan:   Post-operative Plan: Extubation in OR  Informed Consent: I have reviewed the patients History and Physical, chart, labs and discussed the procedure including the risks, benefits and alternatives for the proposed anesthesia with the patient or authorized representative who has indicated his/her understanding and acceptance.     Dental Advisory Given  Plan Discussed with: Anesthesiologist, CRNA and Surgeon  Anesthesia Plan Comments:         Anesthesia Quick Evaluation

## 2023-04-13 NOTE — Interval H&P Note (Signed)
History and Physical Interval Note:  04/13/2023 9:22 AM  Paul Bean  has presented today for surgery, with the diagnosis of inguinal hernia.  The various methods of treatment have been discussed with the patient and family. After consideration of risks, benefits and other options for treatment, the patient has consented to  Procedure(s): XI ROBOTIC ASSISTED INGUINAL HERNIA, possible left (Right) as a surgical intervention.  The patient's history has been reviewed, patient examined, no change in status, stable for surgery.  I have reviewed the patient's chart and labs.  Questions were answered to the patient's satisfaction.    The right is marked.   [name]

## 2023-04-13 NOTE — Op Note (Signed)
Robotic assisted Laparoscopic Transabdominal Right Inguinal Hernia Repair with Mesh       Pre-operative Diagnosis:  Right Inguinal Hernia   Post-operative Diagnosis: Same   Procedure: Robotic assisted Laparoscopic  repair of right inguinal hernia(s)   Surgeon: [name], M.D., FACS   Anesthesia: GETA   Findings: Indirect right  inguinal hernia, no  evidence of left sided hernia.         Procedure Details  The patient was seen again in the Holding Room. The benefits, complications, treatment options, and expected outcomes were discussed with the patient. The risks of bleeding, infection, recurrence of symptoms, failure to resolve symptoms, recurrence of hernia, ischemic orchitis, chronic pain syndrome or neuroma, were reviewed again. The likelihood of improving the patient's symptoms with return to their baseline status is good.  The patient and/or family concurred with the proposed plan, giving informed consent.  The patient was taken to Operating Room, identified  and the procedure verified as Laparoscopic Inguinal Hernia Repair. Laterality confirmed.  A Time Out was held and the above information confirmed.   Prior to the induction of general anesthesia, antibiotic prophylaxis was administered. VTE prophylaxis was in place. General endotracheal anesthesia was then administered and tolerated well. After the induction, the abdomen was prepped with Chloraprep and draped in the sterile fashion. The patient was positioned in the supine position.   After local infiltration of quarter percent Marcaine with epinephrine, stab incision was made left upper quadrant.  On the left at Palmer's point, the Veress needle is passed with sensation of the layers to penetrate the abdominal wall and into the peritoneum.  Saline drop test is confirmed peritoneal placement.  Insufflation is initiated with carbon dioxide to pressures of 15 mmHg. An 8.5 mm port is placed to the left off of the midline, with  blunt tipped trocar.  Pneumoperitoneum maintained w/o HD changes to pressures of 15 mm Hg with CO2. No evidence of bowel injuries.  Two 8.5 mm ports placed under direct vision in each upper quadrant. The laparoscopy revealed a right indirect defect(s).   The robot was brought ot the table and docked in the standard fashion, no collision between arms was observed. Instruments were kept under direct view at all times. For right inguinal hernia repair,  I developed a peritoneal flap. The sac(s) were reduced and dissected free from adjacent structures. We preserved the vas and the vessels, and visualized them to their convergence and beyond in the retroperitoneum. Once dissection was completed an extra- large right sided BARD 3D Light mesh was placed and secured at three points with interrupted 2-0 Vicryl to the pubic tubercle and anteriorly. There was good coverage of the direct, indirect and femoral spaces.  Second look revealed no complications or injuries.  The flap was then closed with 3-0 V-lock suture.  Peritoneal closure without defects.  Once assuring that hemostasis was adequate, all needles/sponges removed, and the robot was undocked.  Under direct visualization I placed the Veress needle into the preperitoneal space the Veress' valve was released allowing extraperitoneal CO2 to escape, it was also used to access the space for supplemental local anesthesia. The ports were removed, the abdomen desulflated.  4-0 subcuticular Monocryl was used at all skin edges. Dermabond was placed.  Patient tolerated the procedure well. There were no complications. He was taken to the recovery room in stable condition.           [name], M.D., FACS 04/13/2023, 11:13 AM

## 2023-04-13 NOTE — Discharge Instructions (Signed)

## 2023-04-14 ENCOUNTER — Encounter: Payer: Self-pay | Admitting: Surgery

## 2023-04-28 ENCOUNTER — Encounter: Payer: Self-pay | Admitting: Physician Assistant

## 2023-04-28 ENCOUNTER — Ambulatory Visit (INDEPENDENT_AMBULATORY_CARE_PROVIDER_SITE_OTHER): Payer: 59 | Admitting: Physician Assistant

## 2023-04-28 ENCOUNTER — Encounter: Payer: Self-pay | Admitting: Family Medicine

## 2023-04-28 VITALS — BP 127/72 | HR 94 | Temp 97.9°F | Ht 73.0 in | Wt 187.4 lb

## 2023-04-28 DIAGNOSIS — Z09 Encounter for follow-up examination after completed treatment for conditions other than malignant neoplasm: Secondary | ICD-10-CM

## 2023-04-28 DIAGNOSIS — K4090 Unilateral inguinal hernia, without obstruction or gangrene, not specified as recurrent: Secondary | ICD-10-CM

## 2023-04-28 NOTE — Progress Notes (Signed)
[facility] POST-OP OFFICE VISIT  04/28/2023  HPI: Paul Bean is a 27 y.o. male 15 days s/p robotic assisted laparoscopic right inguinal hernia repair with Dr Claudine Mouton   He is doing well well Had pain for the first 2-3 days; then got much better No only a little soreness in right groin with getting out of bed No fever, chills, nausea, emesis, or bowel changes Incisions are well healed  No other complaints   Vital signs: BP 127/72   Pulse 94   Temp 97.9 F (36.6 C) (Oral)   Ht 6\' 1"  (1.854 m)   Wt 187 lb 6.4 oz (85 kg)   SpO2 99%   BMI 24.72 kg/m    Physical Exam: Constitutional: Well appearing , NAD Abdomen: Soft, non-tender, non-distended, no rebound/guarding Skin: Laparoscopic incisions are healing well, no erythema or drainage   Assessment/Plan: This is a 27 y.o. male 15 days s/p robotic assisted laparoscopic right inguinal hernia repair with Dr Claudine Mouton    - Pain control prn  - Reviewed wound care recommendation  - Reviewed lifting restrictions; 4-6 weeks total  - He can follow up on as needed basis; He understands to call with questions/concerns  -- Lynden Oxford, PA-C [facility] 04/28/2023, 2:18 PM M-F: 7am - 4pm

## 2023-04-28 NOTE — Patient Instructions (Signed)

## 2023-05-28 ENCOUNTER — Other Ambulatory Visit: Payer: Self-pay

## 2023-05-28 ENCOUNTER — Encounter (HOSPITAL_COMMUNITY): Payer: Self-pay

## 2023-05-28 ENCOUNTER — Inpatient Hospital Stay (HOSPITAL_COMMUNITY)
Admission: EM | Admit: 2023-05-28 | Discharge: 2023-06-01 | DRG: 274 | Disposition: A | Discharge status: Home / Self Care | Payer: 59 | Attending: Cardiovascular Disease | Admitting: Cardiovascular Disease

## 2023-05-28 ENCOUNTER — Emergency Department (HOSPITAL_COMMUNITY): Payer: 59

## 2023-05-28 DIAGNOSIS — Z7901 Long term (current) use of anticoagulants: Secondary | ICD-10-CM

## 2023-05-28 DIAGNOSIS — R000 Tachycardia, unspecified: Secondary | ICD-10-CM | POA: Diagnosis not present

## 2023-05-28 DIAGNOSIS — Z8249 Family history of ischemic heart disease and other diseases of the circulatory system: Secondary | ICD-10-CM

## 2023-05-28 DIAGNOSIS — I471 Supraventricular tachycardia, unspecified: Secondary | ICD-10-CM | POA: Diagnosis present

## 2023-05-28 DIAGNOSIS — R55 Syncope and collapse: Secondary | ICD-10-CM | POA: Diagnosis not present

## 2023-05-28 DIAGNOSIS — I472 Ventricular tachycardia, unspecified: Secondary | ICD-10-CM | POA: Diagnosis not present

## 2023-05-28 DIAGNOSIS — E876 Hypokalemia: Secondary | ICD-10-CM | POA: Diagnosis not present

## 2023-05-28 DIAGNOSIS — Z8042 Family history of malignant neoplasm of prostate: Secondary | ICD-10-CM

## 2023-05-28 DIAGNOSIS — I456 Pre-excitation syndrome: Principal | ICD-10-CM | POA: Diagnosis present

## 2023-05-28 DIAGNOSIS — I4729 Other ventricular tachycardia: Secondary | ICD-10-CM | POA: Diagnosis not present

## 2023-05-28 LAB — COMPREHENSIVE METABOLIC PANEL
ALT: 18 U/L (ref 0–44)
AST: 20 U/L (ref 15–41)
Albumin: 5.1 g/dL — ABNORMAL HIGH (ref 3.5–5.0)
Alkaline Phosphatase: 76 U/L (ref 38–126)
Anion gap: 13 (ref 5–15)
BUN: 20 mg/dL (ref 6–20)
CO2: 18 mmol/L — ABNORMAL LOW (ref 22–32)
Calcium: 9.7 mg/dL (ref 8.9–10.3)
Chloride: 107 mmol/L (ref 98–111)
Creatinine, Ser: 0.92 mg/dL (ref 0.61–1.24)
GFR, Estimated: >60 mL/min (ref >60)
Glucose, Bld: 101 mg/dL — ABNORMAL HIGH (ref 70–99)
Potassium: 3.4 mmol/L — ABNORMAL LOW (ref 3.5–5.1)
Sodium: 138 mmol/L (ref 135–145)
Total Bilirubin: 1 mg/dL (ref 0.3–1.2)
Total Protein: 8 g/dL (ref 6.5–8.1)

## 2023-05-28 LAB — CBC WITH DIFFERENTIAL/PLATELET
Abs Immature Granulocytes: 0.02 10*3/uL (ref 0.00–0.07)
Basophils Absolute: 0 10*3/uL (ref 0.0–0.1)
Basophils Relative: 1 %
Eosinophils Absolute: 0.1 10*3/uL (ref 0.0–0.5)
Eosinophils Relative: 1 %
HCT: 48.1 % (ref 39.0–52.0)
Hemoglobin: 16.1 g/dL (ref 13.0–17.0)
Immature Granulocytes: 0 %
Lymphocytes Relative: 29 %
Lymphs Abs: 2.2 10*3/uL (ref 0.7–4.0)
MCH: 30.1 pg (ref 26.0–34.0)
MCHC: 33.5 g/dL (ref 30.0–36.0)
MCV: 90.1 fL (ref 80.0–100.0)
Monocytes Absolute: 0.4 10*3/uL (ref 0.1–1.0)
Monocytes Relative: 6 %
Neutro Abs: 4.8 10*3/uL (ref 1.7–7.7)
Neutrophils Relative %: 63 %
Platelets: 290 10*3/uL (ref 150–400)
RBC: 5.34 MIL/uL (ref 4.22–5.81)
RDW: 13.1 % (ref 11.5–15.5)
WBC: 7.5 10*3/uL (ref 4.0–10.5)
nRBC: 0 % (ref 0.0–0.2)

## 2023-05-28 LAB — MAGNESIUM: Magnesium: 2 mg/dL (ref 1.7–2.4)

## 2023-05-28 LAB — TROPONIN I (HIGH SENSITIVITY): Troponin I (High Sensitivity): 4 ng/L (ref <18)

## 2023-05-28 LAB — CBG MONITORING, ED: Glucose-Capillary: 87 mg/dL (ref 70–99)

## 2023-05-28 MED ORDER — AMIODARONE HCL IN DEXTROSE 360-4.14 MG/200ML-% IV SOLN
60.0000 mg/h | INTRAVENOUS | Status: DC
  Administered 2023-05-28: 60 mg/h via INTRAVENOUS
  Administered 2023-05-29: 30 mg/h via INTRAVENOUS
  Filled 2023-05-28: qty 200

## 2023-05-28 MED ORDER — FENTANYL CITRATE (PF) 100 MCG/2ML IJ SOLN
INTRAMUSCULAR | Status: AC
  Filled 2023-05-28: qty 2

## 2023-05-28 MED ORDER — ETOMIDATE 2 MG/ML IV SOLN: 15.0000 mg | Freq: Once | INTRAVENOUS | Status: AC

## 2023-05-28 MED ORDER — POTASSIUM CHLORIDE CRYS ER 20 MEQ PO TBCR
40.0000 meq | EXTENDED_RELEASE_TABLET | ORAL | Status: AC
  Administered 2023-05-28: 40 meq via ORAL
  Filled 2023-05-28: qty 2

## 2023-05-28 MED ORDER — ETOMIDATE 2 MG/ML IV SOLN
INTRAVENOUS | Status: AC
  Administered 2023-05-28: 15 mg via INTRAVENOUS
  Filled 2023-05-28: qty 10

## 2023-05-28 MED ORDER — AMIODARONE LOAD VIA INFUSION
300.0000 mg | Freq: Once | INTRAVENOUS | Status: AC
  Administered 2023-05-28: 300 mg via INTRAVENOUS
  Filled 2023-05-28: qty 166.67

## 2023-05-28 MED ORDER — ONDANSETRON HCL 4 MG/2ML IJ SOLN
INTRAMUSCULAR | Status: AC
  Administered 2023-05-28: 4 mg via INTRAVENOUS
  Filled 2023-05-28: qty 2

## 2023-05-28 MED ORDER — AMIODARONE HCL IN DEXTROSE 360-4.14 MG/200ML-% IV SOLN
INTRAVENOUS | Status: AC
  Filled 2023-05-28: qty 200

## 2023-05-28 MED ORDER — AMIODARONE HCL IN DEXTROSE 360-4.14 MG/200ML-% IV SOLN
60.0000 mg/h | INTRAVENOUS | Status: DC
  Administered 2023-05-28: 60 mg/h via INTRAVENOUS
  Filled 2023-05-28: qty 200
  Filled 2023-05-28: qty 400

## 2023-05-28 NOTE — ED Triage Notes (Signed)
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  Walked to the closest house and cooled down with hose. Then passed out in gravel driveway.  Pt stated he passed out at least 4 times. Every time he stands he passes out. Pt stated that his heart was racing while laying on the ground.  Hx of WPW Complains of SOB, HA and blurred vision  Denies CP, n/v/d  HR in triage 39-77

## 2023-05-28 NOTE — ED Provider Notes (Signed)
[place] EMERGENCY DEPARTMENT AT [facility] Provider Note   CSN: [id number] Arrival date & time: 05/28/23  1804     History {Add pertinent medical, surgical, social history, OB history to HPI:1} Chief Complaint  Patient presents with   Loss of Consciousness    Paul Bean is a 27 y.o. male.  27 year old male with history of WPW not currently on any medications who presents emergency department with syncope.  Patient reports he was outside all day and had 4 syncopal episodes.  Was brought back to the room and was noted to be in a wide-complex tachycardia so was called to evaluate the patient.  Last ate at 11 AM today.  Has never required any procedures for his WPW.       Home Medications Prior to Admission medications   Medication Sig Start Date End Date Taking? Authorizing Provider  ibuprofen (ADVIL) 800 MG tablet Take 1 tablet (800 mg total) by mouth every 8 (eight) hours as needed. 04/13/23   [name], MD      Allergies    Penicillins    Review of Systems   Review of Systems  Physical Exam Updated Vital Signs BP (!) 118/92   Pulse (!) 45   Temp 98.6 F (37 C) (Temporal)   Resp 16   Ht 6\' 1"  (1.854 m)   Wt 83.9 kg   SpO2 100%   BMI 24.41 kg/m  Physical Exam Vitals and nursing note reviewed.  Constitutional:      General: He is in acute distress.     Appearance: He is well-developed. He is ill-appearing.  HENT:     Head: Normocephalic and atraumatic.     Right Ear: External ear normal.     Left Ear: External ear normal.     Nose: Nose normal.  Eyes:     Extraocular Movements: Extraocular movements intact.     Conjunctiva/sclera: Conjunctivae normal.     Pupils: Pupils are equal, round, and reactive to light.  Cardiovascular:     Rate and Rhythm: Tachycardia present. Rhythm irregular.     Heart sounds: Normal heart sounds.     Comments: Polymorphic V. tach on the monitor Pulmonary:     Effort: Pulmonary effort is normal. No  respiratory distress.     Breath sounds: Normal breath sounds.  Musculoskeletal:     Cervical back: Normal range of motion and neck supple.     Right lower leg: No edema.     Left lower leg: No edema.  Skin:    General: Skin is warm and dry.  Neurological:     Mental Status: He is alert. Mental status is at baseline.  Psychiatric:        Mood and Affect: Mood normal.        Behavior: Behavior normal.     ED Results / Procedures / Treatments   Labs (all labs ordered are listed, but only abnormal results are displayed) Labs Reviewed  CBC WITH DIFFERENTIAL/PLATELET  COMPREHENSIVE METABOLIC PANEL  MAGNESIUM  CBG MONITORING, ED  TROPONIN I (HIGH SENSITIVITY)    EKG None  Radiology No results found.  Procedures .Cardioversion  Date/Time: 05/28/2023 6:59 PM  Performed by: Rondel Baton, MD Authorized by: Rondel Baton, MD   Consent:    Consent obtained:  Emergent situation   Consent given by:  Patient Pre-procedure details:    Cardioversion basis:  Emergent   Rhythm:  Ventricular tachycardia   Electrode placement:  Anterior-posterior Patient sedated:  Yes. Refer to sedation procedure documentation for details of sedation.  Attempt one:    Cardioversion mode:  Synchronous   Waveform:  Monophasic   Shock (Joules):  120   Shock outcome:  Conversion to normal sinus rhythm Post-procedure details:    Patient status:  Alert   Patient tolerance of procedure:  Tolerated well, no immediate complications .Sedation  Date/Time: 05/28/2023 7:00 PM  Performed by: Rondel Baton, MD Authorized by: Rondel Baton, MD   Consent:    Consent obtained:  Emergent situation   Consent given by:  Patient   Alternatives discussed:  Analgesia without sedation Universal protocol:    Immediately prior to procedure, a time out was called: yes     Patient identity confirmed:  Verbally with patient Indications:    Procedure performed:  Cardioversion   Procedure  necessitating sedation performed by:  Physician performing sedation Pre-sedation assessment:    Time since last food or drink:  7   ASA classification: class 1 - normal, healthy patient     Mouth opening:  3 or more finger widths   Thyromental distance:  4 finger widths   Mallampati score:  III - soft palate, base of uvula visible   Neck mobility: normal     Pre-sedation assessments completed and reviewed: pre-procedure airway patency not reviewed and pre-procedure mental status not reviewed     Pre-sedation assessment completed:  05/28/2023 6:45 PM Immediate pre-procedure details:    Reassessment: Patient reassessed immediately prior to procedure   Procedure details (see MAR for exact dosages):    Preoxygenation:  Nasal cannula   Sedation:  Etomidate   Intended level of sedation: deep   Intra-procedure monitoring:  Blood pressure monitoring, continuous capnometry, frequent LOC assessments, continuous pulse oximetry and cardiac monitor   Intra-procedure events: none     Total Provider sedation time (minutes):  15 Post-procedure details:    Post-sedation assessment completed:  05/28/2023 7:01 PM   Attendance: Constant attendance by certified staff until patient recovered     Recovery: Patient returned to pre-procedure baseline     Post-sedation assessments completed and reviewed: post-procedure airway patency not reviewed and post-procedure mental status not reviewed     Procedure completion:  Tolerated well, no immediate complications   {Document cardiac monitor, telemetry assessment procedure when appropriate:1}  Medications Ordered in ED Medications  fentaNYL (SUBLIMAZE) 100 MCG/2ML injection (  Not Given 05/28/23 1832)  etomidate (AMIDATE) injection 15 mg (15 mg Intravenous Given 05/28/23 1828)  ondansetron (ZOFRAN) 4 MG/2ML injection (4 mg  Given 05/28/23 1832)    ED Course/ Medical Decision Making/ A&P Clinical Course as of 05/28/23 1858  Sat May 28, 2023  1856 Discussed  with on-call cardiologist who recommends 300 mg of amiodarone load and continuing the drip and admitting him to stepdown at [facility]. [RP]    Clinical Course User Index [RP] Rondel Baton, MD   {   Click here for ABCD2, HEART and other calculatorsREFRESH Note before signing :1}                              Medical Decision Making Amount and/or Complexity of Data Reviewed Labs: ordered. Radiology: ordered. ECG/medicine tests: ordered.  Risk Prescription drug management. Decision regarding hospitalization.   ***  {Document critical care time when appropriate:1} {Document review of labs and clinical decision tools ie heart score, Chads2Vasc2 etc:1}  {Document your independent review of radiology images, and any  outside records:1} {Document your discussion with family members, caretakers, and with consultants:1} {Document social determinants of health affecting pt's care:1} {Document your decision making why or why not admission, treatments were needed:1} Final Clinical Impression(s) / ED Diagnoses Final diagnoses:  None    Rx / DC Orders ED Discharge Orders     None

## 2023-05-28 NOTE — ED Notes (Signed)
Pt came into room and had a spontaneous rhythm change from bradycardia to vtach in the 250's. Team with MD at bedside obtained access, placed pt on pads, gave meds, and shock. Pt is now alert oriented and in NSR at 96 bpm.

## 2023-05-29 ENCOUNTER — Encounter (HOSPITAL_COMMUNITY): Payer: Self-pay | Admitting: Internal Medicine

## 2023-05-29 ENCOUNTER — Inpatient Hospital Stay (HOSPITAL_COMMUNITY): Payer: 59

## 2023-05-29 DIAGNOSIS — I472 Ventricular tachycardia, unspecified: Secondary | ICD-10-CM

## 2023-05-29 DIAGNOSIS — I456 Pre-excitation syndrome: Secondary | ICD-10-CM | POA: Diagnosis present

## 2023-05-29 DIAGNOSIS — R000 Tachycardia, unspecified: Secondary | ICD-10-CM | POA: Diagnosis present

## 2023-05-29 LAB — CBC WITH DIFFERENTIAL/PLATELET
Abs Immature Granulocytes: 0.01 10*3/uL (ref 0.00–0.07)
Basophils Absolute: 0 10*3/uL (ref 0.0–0.1)
Basophils Relative: 1 %
Eosinophils Absolute: 0 10*3/uL (ref 0.0–0.5)
Eosinophils Relative: 0 %
HCT: 44.3 % (ref 39.0–52.0)
Hemoglobin: 15.2 g/dL (ref 13.0–17.0)
Immature Granulocytes: 0 %
Lymphocytes Relative: 27 %
Lymphs Abs: 1.6 10*3/uL (ref 0.7–4.0)
MCH: 30.8 pg (ref 26.0–34.0)
MCHC: 34.3 g/dL (ref 30.0–36.0)
MCV: 89.7 fL (ref 80.0–100.0)
Monocytes Absolute: 0.4 10*3/uL (ref 0.1–1.0)
Monocytes Relative: 6 %
Neutro Abs: 4 10*3/uL (ref 1.7–7.7)
Neutrophils Relative %: 66 %
Platelets: 277 10*3/uL (ref 150–400)
RBC: 4.94 MIL/uL (ref 4.22–5.81)
RDW: 12.9 % (ref 11.5–15.5)
WBC: 6 10*3/uL (ref 4.0–10.5)
nRBC: 0 % (ref 0.0–0.2)

## 2023-05-29 LAB — COMPREHENSIVE METABOLIC PANEL
ALT: 16 U/L (ref 0–44)
AST: 15 U/L (ref 15–41)
Albumin: 4.2 g/dL (ref 3.5–5.0)
Alkaline Phosphatase: 66 U/L (ref 38–126)
Anion gap: 11 (ref 5–15)
BUN: 14 mg/dL (ref 6–20)
CO2: 21 mmol/L — ABNORMAL LOW (ref 22–32)
Calcium: 8.9 mg/dL (ref 8.9–10.3)
Chloride: 106 mmol/L (ref 98–111)
Creatinine, Ser: 1 mg/dL (ref 0.61–1.24)
GFR, Estimated: >60 mL/min (ref >60)
Glucose, Bld: 93 mg/dL (ref 70–99)
Potassium: 3.8 mmol/L (ref 3.5–5.1)
Sodium: 138 mmol/L (ref 135–145)
Total Bilirubin: 0.9 mg/dL (ref 0.3–1.2)
Total Protein: 6.9 g/dL (ref 6.5–8.1)

## 2023-05-29 LAB — CBC
HCT: 43.9 % (ref 39.0–52.0)
Hemoglobin: 14.7 g/dL (ref 13.0–17.0)
MCH: 29.9 pg (ref 26.0–34.0)
MCHC: 33.5 g/dL (ref 30.0–36.0)
MCV: 89.4 fL (ref 80.0–100.0)
Platelets: 266 10*3/uL (ref 150–400)
RBC: 4.91 MIL/uL (ref 4.22–5.81)
RDW: 13.1 % (ref 11.5–15.5)
WBC: 5.1 10*3/uL (ref 4.0–10.5)
nRBC: 0 % (ref 0.0–0.2)

## 2023-05-29 LAB — T4, FREE: Free T4: 1.07 ng/dL (ref 0.61–1.12)

## 2023-05-29 LAB — CREATININE, SERUM
Creatinine, Ser: 1.29 mg/dL — ABNORMAL HIGH (ref 0.61–1.24)
GFR, Estimated: >60 mL/min (ref >60)

## 2023-05-29 LAB — TSH: TSH: 1.238 u[IU]/mL (ref 0.350–4.500)

## 2023-05-29 LAB — MAGNESIUM: Magnesium: 2 mg/dL (ref 1.7–2.4)

## 2023-05-29 MED ORDER — ENOXAPARIN SODIUM 40 MG/0.4ML IJ SOSY
40.0000 mg | PREFILLED_SYRINGE | INTRAMUSCULAR | Status: DC
  Administered 2023-05-29: 40 mg via SUBCUTANEOUS
  Filled 2023-05-29 (×2): qty 0.4

## 2023-05-29 MED ORDER — ONDANSETRON HCL 4 MG/2ML IJ SOLN
4.0000 mg | Freq: Four times a day (QID) | INTRAMUSCULAR | Status: DC | PRN
  Administered 2023-05-31: 300 mg via INTRAVENOUS
  Administered 2023-05-31: 4 mg via INTRAVENOUS

## 2023-05-29 MED ORDER — ACETAMINOPHEN 325 MG PO TABS: 650.0000 mg | ORAL_TABLET | ORAL | Status: DC | PRN

## 2023-05-29 MED ORDER — ONDANSETRON HCL 4 MG/2ML IJ SOLN
4.0000 mg | Freq: Once | INTRAMUSCULAR | Status: AC
  Filled 2023-05-29: qty 2

## 2023-05-29 MED ORDER — SODIUM CHLORIDE 0.9% FLUSH
3.0000 mL | Freq: Two times a day (BID) | INTRAVENOUS | Status: DC
  Administered 2023-05-29 – 2023-05-31 (×5): 3 mL via INTRAVENOUS

## 2023-05-29 NOTE — Plan of Care (Signed)
  Problem: Education: Goal: Knowledge of General Education information will improve Description: Including pain rating scale, medication(s)/side effects and non-pharmacologic comfort measures Outcome: Progressing   Problem: Cardiac: Goal: Will achieve and/or maintain adequate cardiac output Outcome: Completed/Met   Problem: Clinical Measurements: Goal: Ability to maintain clinical measurements within normal limits will improve Outcome: Completed/Met   Problem: Nutrition: Goal: Adequate nutrition will be maintained Outcome: Completed/Met   Problem: Elimination: Goal: Will not experience complications related to urinary retention Outcome: Completed/Met

## 2023-05-29 NOTE — ED Notes (Signed)
ED TO INPATIENT HANDOFF REPORT  ED Nurse Name and Phone #: Wandra Mannan, Paramedic  S Name/Age/Gender Paul Bean 27 y.o. male Room/Bed: APA02/APA02  Code Status   Code Status: Full Code  Home/SNF/Other Home Patient oriented to: self, place, time, and situation Is this baseline? 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  Walked to the closest house and cooled down with hose. Then passed out in gravel driveway.  Pt stated he passed out at least 4 times. Every time he stands he passes out. Pt stated that his heart was racing while laying on the ground.  Hx of WPW Complains of SOB, HA and blurred vision  Denies CP, n/v/d  HR in triage 39-77   Allergies Allergies  Allergen Reactions   Penicillins Other (See Comments)    As a child (mother was allergic)    Level of Care/Admitting Diagnosis ED Disposition     ED Disposition  Admit   Condition  --   Comment  Hospital Area: [facility] [[id number]]  Level of Care: Telemetry Cardiac [103]  May admit patient to [facility] or [facility] if equivalent level of care is available:: Yes  Interfacility transfer: Yes  Covid Evaluation: Asymptomatic - no recent exposure (last 10 days) testing not required  Diagnosis: Wide-complex tachycardia [277517]  Admitting Physician: Macie Burows [[id number]]  Attending Physician: Macie Burows [[id number]]  Certification:: I certify this patient will need inpatient services for at least 2 midnights          B Medical/Surgery History Past Medical History:  Diagnosis Date   History of chickenpox    History of echocardiogram    a.) TTE 10/21/2015: EF >55%, no RWMAs, ? PFO present, triv TR   Inguinal hernia, right 03/2023   Migraine    PSVT (paroxysmal  supraventricular tachycardia) (HCC)    WPW (Wolff-Parkinson-White syndrome)    Past Surgical History:  Procedure Laterality Date   INSERTION OF MESH Right 04/13/2023   Procedure: INSERTION OF MESH;  Surgeon: [name], MD;  Location: [facility] ORS;  Service: General;  Laterality: Right;   NO PAST SURGERIES       A IV Location/Drains/Wounds Patient Lines/Drains/Airways Status     Active Line/Drains/Airways     Name Placement date Placement time Site Days   Peripheral IV 05/28/23 20 G Anterior;Distal;Right;Upper Arm 05/28/23  1825  Arm  1            Intake/Output Last 24 hours  Intake/Output Summary (Last 24 hours) at 05/29/2023 1143 Last data filed at 05/29/2023 0843 Gross per 24 hour  Intake 490.44 ml  Output 1490 ml  Net -999.56 ml    Labs/Imaging Results for orders placed or performed during the hospital encounter of 05/28/23 (from the past 48 hour(s))  CBG monitoring, ED     Status: None   Collection Time: 05/28/23  6:24 PM  Result Value Ref Range   Glucose-Capillary 87 70 - 99 mg/dL    Comment: Glucose reference range applies only to samples taken after fasting for at least 8 hours.  CBC with Differential     Status: None   Collection Time: 05/28/23  6:59 PM  Result Value Ref Range   WBC 7.5 4.0 - 10.5 K/uL   RBC 5.34 4.22 - 5.81 MIL/uL  Hemoglobin 16.1 13.0 - 17.0 g/dL   HCT 93.2 35.5 - 73.2 %   MCV 90.1 80.0 - 100.0 fL   MCH 30.1 26.0 - 34.0 pg   MCHC 33.5 30.0 - 36.0 g/dL   RDW 20.2 54.2 - 70.6 %   Platelets 290 150 - 400 K/uL   nRBC 0.0 0.0 - 0.2 %   Neutrophils Relative % 63 %   Neutro Abs 4.8 1.7 - 7.7 K/uL   Lymphocytes Relative 29 %   Lymphs Abs 2.2 0.7 - 4.0 K/uL   Monocytes Relative 6 %   Monocytes Absolute 0.4 0.1 - 1.0 K/uL   Eosinophils Relative 1 %   Eosinophils Absolute 0.1 0.0 - 0.5 K/uL   Basophils Relative 1 %   Basophils Absolute 0.0 0.0 - 0.1 K/uL   Immature Granulocytes 0 %   Abs Immature Granulocytes 0.02 0.00 - 0.07 K/uL     Comment: Performed at [facility], [address] [place], [place] [address]  Comprehensive metabolic panel     Status: Abnormal   Collection Time: 05/28/23  6:59 PM  Result Value Ref Range   Sodium 138 135 - 145 mmol/L   Potassium 3.4 (L) 3.5 - 5.1 mmol/L   Chloride 107 98 - 111 mmol/L   CO2 18 (L) 22 - 32 mmol/L   Glucose, Bld 101 (H) 70 - 99 mg/dL    Comment: Glucose reference range applies only to samples taken after fasting for at least 8 hours.   BUN 20 6 - 20 mg/dL   Creatinine, Ser 8.31 0.61 - 1.24 mg/dL   Calcium 9.7 8.9 - 51.7 mg/dL   Total Protein 8.0 6.5 - 8.1 g/dL   Albumin 5.1 (H) 3.5 - 5.0 g/dL   AST 20 15 - 41 U/L   ALT 18 0 - 44 U/L   Alkaline Phosphatase 76 38 - 126 U/L   Total Bilirubin 1.0 0.3 - 1.2 mg/dL   GFR, Estimated >61 >60 mL/min    Comment: (NOTE) Calculated using the CKD-EPI Creatinine Equation (2021)    Anion gap 13 5 - 15    Comment: Performed at [facility], [address], [place], [place] [address]  Magnesium     Status: None   Collection Time: 05/28/23  6:59 PM  Result Value Ref Range   Magnesium 2.0 1.7 - 2.4 mg/dL    Comment: Performed at [facility], [address], [place], [place] [address]  Troponin I (High Sensitivity)     Status: None   Collection Time: 05/28/23  6:59 PM  Result Value Ref Range   Troponin I (High Sensitivity) 4 <18 ng/L    Comment: (NOTE) Elevated high sensitivity troponin I (hsTnI) values and significant  changes across serial measurements may suggest ACS but many other  chronic and acute conditions are known to elevate hsTnI results.  Refer to the "Links" section for chest pain algorithms and additional  guidance. Performed at [facility] [address], [place], [place] [address]    DG Chest Port 1 View  Result Date: 05/28/2023 CLINICAL DATA:  Syncope. EXAM: PORTABLE CHEST 1 VIEW COMPARISON:  March 06, 2016 FINDINGS: Limited study secondary to patient positioning and an overlying defibrillator pad. The  heart size and mediastinal contours are within normal limits. Both lungs are clear. The visualized skeletal structures are unremarkable. IMPRESSION: No active disease. Electronically Signed   By: Aram Candela M.D.   On: 05/28/2023 20:11    Pending Labs Unresulted Labs (From admission,  onward)     Start     Ordered   Signed and Held  CBC  (enoxaparin (LOVENOX)    CrCl >/= 30 ml/min)  Once,   R       Comments: Baseline for enoxaparin therapy IF NOT ALREADY DRAWN.  Notify MD if PLT < 100 K.    Signed and Held   Signed and Held  Creatinine, serum  (enoxaparin (LOVENOX)    CrCl >/= 30 ml/min)  Once,   R       Comments: Baseline for enoxaparin therapy IF NOT ALREADY DRAWN.    Signed and Held   Signed and Held  Creatinine, serum  (enoxaparin (LOVENOX)    CrCl >/= 30 ml/min)  Weekly,   R     Comments: while on enoxaparin therapy    Signed and Held            Vitals/Pain Today's Vitals   05/29/23 0803 05/29/23 0815 05/29/23 0830 05/29/23 0845  BP:  110/70 106/63 107/64  Pulse:  64 66 65  Resp:  15 16 12   Temp: 98.2 F (36.8 C)     TempSrc: Oral     SpO2:  99% 98% 99%  Weight:      Height:      PainSc:        Isolation Precautions No active isolations  Medications Medications  fentaNYL (SUBLIMAZE) 100 MCG/2ML injection (  Not Given 05/28/23 1832)  amiodarone (NEXTERONE) 1.8 mg/mL load via infusion 300 mg (300 mg Intravenous Bolus from Bag 05/28/23 1856)    Followed by  amiodarone (NEXTERONE PREMIX) 360-4.14 MG/200ML-% (1.8 mg/mL) IV infusion (0 mg/hr Intravenous Stopped 05/29/23 0124)    Followed by  amiodarone (NEXTERONE PREMIX) 360-4.14 MG/200ML-% (1.8 mg/mL) IV infusion (30 mg/hr Intravenous Infusion Verify 05/29/23 0843)  etomidate (AMIDATE) injection 15 mg (15 mg Intravenous Given 05/28/23 1828)  potassium chloride SA (KLOR-CON M) CR tablet 40 mEq (40 mEq Oral Given 05/28/23 1944)  ondansetron (ZOFRAN) injection 4 mg (4 mg Intravenous Given 05/28/23 1832)     Mobility walks     Focused Assessments Cardiac Assessment Handoff:  Cardiac Rhythm: Normal sinus rhythm (HR 74) Lab Results  Component Value Date   CKTOTAL 132 03/06/2016   No results found for: "DDIMER" Does the Patient currently have chest pain? No    R Recommendations: See Admitting Provider Note  Report given to:   Additional Notes: WPW. Pt had erratic cardiac activity yesterday causing syncope. Bradycardic upon arrival to ED with SVT shortly after arrival. Synchronized cardioversion performed with successful conversion to sinus rhythm. Pain immediate relieved.

## 2023-05-29 NOTE — Progress Notes (Signed)
Consulted for admission. Patient has history of WPW and had 4 syncopal episodes. He came in with polymorphic tachycardia. He was shocked out of it, and cards recommended amiodarone drip. Patient has no other medical problems. I discussed with cards that this patient would be best served admitted to their service. They agree. Dr. Dannial Monarch accepted admission. Company secretary notified.

## 2023-05-29 NOTE — H&P (Addendum)
Cardiology Admission History and Physical   Patient ID: Paul Bean MRN: [id number]; DOB: [birth year]   Admission date: 05/28/2023  PCP:  Alveria Apley, NP   [facility] Providers Cardiologist:  None  Electrophysiologist:  Lewayne Bunting, MD  {  Chief Complaint:  syncope, tachycardia, WPW  Patient Profile:   Paul Bean is a 27 y.o. male with WPW  who is being seen 05/29/2023 for the evaluation of tachycardia and syncope.  History of Present Illness:   Mr. Murin was referred to Dr. Ladona Ridgel for WPW on EKG. He was last seen 03/15/23 and was asymptomatic without SVT. He was referred for hernia surgery and cleared to proceed. Following hernia surgery, they were considering catheter ablation.   He underwent hernia surgery 04/13/23 and did well. He was seen at post op with good pain management and following lifting restrictions.  He presented to [facility] ER 05/28/23 after several syncopal episodes. He was working on his farm. He stood up and passed out for ~10 min. He walked to the closest house to cool down with a water hose and passed out again. In total, reports 4 syncopal episodes. On arrival, complained of blurred vision, SOB, and headache. Telemetry showed wide complex tachycardia concerning for polymorphic VT. Pt was sedated and cardioverted to NSR.  Cardiology at [facility] was called and recommended amiodarone gtt. He was transferred to [facility] for admission and EP evaluation.   During my interview, sounds like he had an unwitnessed syncopal event, timing unclear but he thinks about 10 min. After getting to the house, he tried vagal maneuvers and felt episodes of dizziness and "everything going black." When he got these sensations, he sat down and then rested supine with some symptom resolution. When he felt better he sat up, with recurrent symptoms - this cycle occurred 3 times.   He is currently resting in bed with mom at bedside, no complaints.   Past Medical  History:  Diagnosis Date   History of chickenpox    History of echocardiogram    a.) TTE 10/21/2015: EF >55%, no RWMAs, ? PFO present, triv TR   Inguinal hernia, right 03/2023   Migraine    PSVT (paroxysmal supraventricular tachycardia) (HCC)    WPW (Wolff-Parkinson-White syndrome)     Past Surgical History:  Procedure Laterality Date   INSERTION OF MESH Right 04/13/2023   Procedure: INSERTION OF MESH;  Surgeon: [name], MD;  Location: [facility] ORS;  Service: General;  Laterality: Right;   NO PAST SURGERIES       Medications Prior to Admission: Prior to Admission medications   Not on File     Allergies:    Allergies  Allergen Reactions   Penicillins Other (See Comments)    As a child (mother was allergic)    Social History:   Social History   Socioeconomic History   Marital status: Single    Spouse name: Not on file   Number of children: 0   Years of education: 12   Highest education level: High school graduate  Occupational History   Occupation: Landscaping  Tobacco Use   Smoking status: Never   Smokeless tobacco: Never  Vaping Use   Vaping status: Never Used  Substance and Sexual Activity   Alcohol use: No   Drug use: No   Sexual activity: Not on file  Other Topics Concern   Not on file  Social History Narrative   Not on file   Social Determinants of Health  Financial Resource Strain: Low Risk  (02/22/2023)   Overall Financial Resource Strain (CARDIA)    Difficulty of Paying Living Expenses: Not hard at all  Food Insecurity: No Food Insecurity (05/29/2023)   Hunger Vital Sign    Worried About Running Out of Food in the Last Year: Never true    Ran Out of Food in the Last Year: Never true  Transportation Needs: No Transportation Needs (05/29/2023)   PRAPARE - Administrator, Civil Service (Medical): No    Lack of Transportation (Non-Medical): No  Physical Activity: Inactive (02/22/2023)   Exercise Vital Sign    Days of Exercise per  Week: 0 days    Minutes of Exercise per Session: 0 min  Stress: No Stress Concern Present (02/22/2023)   [employer] of Occupational Health - Occupational Stress Questionnaire    Feeling of Stress : Not at all  Social Connections: Moderately Isolated (02/22/2023)   Social Connection and Isolation Panel [NHANES]    Frequency of Communication with Friends and Family: More than three times a week    Frequency of Social Gatherings with Friends and Family: Twice a week    Attends Religious Services: 1 to 4 times per year    Active Member of [employer] or Organizations: No    Attends Banker Meetings: Never    Marital Status: Never married  Intimate Partner Violence: Not At Risk (05/29/2023)   Humiliation, Afraid, Rape, and Kick questionnaire    Fear of Current or Ex-Partner: No    Emotionally Abused: No    Physically Abused: No    Sexually Abused: No    Family History:   The patient's family history includes CAD in his paternal grandfather; Cancer in his maternal grandfather; Heart disease in his paternal grandmother; Prostate cancer in his paternal grandfather.    ROS:  Please see the history of present illness.  All other ROS reviewed and negative.     Physical Exam/Data:   Vitals:   05/29/23 1215 05/29/23 1230 05/29/23 1245 05/29/23 1358  BP: 99/66 100/68 125/67 130/75  Pulse: 71 73 72   Resp: 17 19 19 18   Temp:    97.9 F (36.6 C)  TempSrc:    Oral  SpO2: 99% 97% 99%   Weight:      Height:        Intake/Output Summary (Last 24 hours) at 05/29/2023 1704 Last data filed at 05/29/2023 0843 Gross per 24 hour  Intake 490.44 ml  Output 1490 ml  Net -999.56 ml      05/28/2023    6:14 PM 04/28/2023    2:08 PM 04/13/2023    8:53 AM  Last 3 Weights  Weight (lbs) 185 lb 187 lb 6.4 oz 185 lb 15.7 oz  Weight (kg) 83.915 kg 85.004 kg 84.36 kg     Body mass index is 24.41 kg/m.  General:  Well nourished, well developed, in no acute distress HEENT: normal Neck: no  JVD Vascular: No carotid bruits; Distal pulses 2+ bilaterally   Cardiac:  normal S1, S2; RRR; no murmur  Lungs:  clear to auscultation bilaterally, no wheezing, rhonchi or rales  Abd: soft, nontender, no hepatomegaly  Ext: no edema Musculoskeletal:  No deformities, BUE and BLE strength normal and equal Skin: warm and dry  Neuro:  CNs 2-12 intact, no focal abnormalities noted Psych:  Normal affect    EKG:  The ECG that was done  was personally reviewed and demonstrates sinus rhythm with HR 66,  appears to have ectopic atrial rhythm, short PR, widened QRS 161 ms, ST changes consistent with prior WPW tracings   Relevant CV Studies:  Echo pending  Laboratory Data:  High Sensitivity Troponin:   Recent Labs  Lab 05/28/23 1859  TROPONINIHS 4      Chemistry Recent Labs  Lab 05/28/23 1859 05/29/23 1538  NA 138  --   K 3.4*  --   CL 107  --   CO2 18*  --   GLUCOSE 101*  --   BUN 20  --   CREATININE 0.92 1.29*  CALCIUM 9.7  --   MG 2.0  --   GFRNONAA >60 >60  ANIONGAP 13  --     Recent Labs  Lab 05/28/23 1859  PROT 8.0  ALBUMIN 5.1*  AST 20  ALT 18  ALKPHOS 76  BILITOT 1.0   Lipids No results for input(s): "CHOL", "TRIG", "HDL", "LABVLDL", "LDLCALC", "CHOLHDL" in the last 168 hours. Hematology Recent Labs  Lab 05/28/23 1859 05/29/23 1538  WBC 7.5 5.1  RBC 5.34 4.91  HGB 16.1 14.7  HCT 48.1 43.9  MCV 90.1 89.4  MCH 30.1 29.9  MCHC 33.5 33.5  RDW 13.1 13.1  PLT 290 266   Thyroid No results for input(s): "TSH", "FREET4" in the last 168 hours. BNPNo results for input(s): "BNP", "PROBNP" in the last 168 hours.  DDimer No results for input(s): "DDIMER" in the last 168 hours.   Radiology/Studies:  DG Chest Port 1 View  Result Date: 05/28/2023 CLINICAL DATA:  Syncope. EXAM: PORTABLE CHEST 1 VIEW COMPARISON:  March 06, 2016 FINDINGS: Limited study secondary to patient positioning and an overlying defibrillator pad. The heart size and mediastinal contours are  within normal limits. Both lungs are clear. The visualized skeletal structures are unremarkable. IMPRESSION: No active disease. Electronically Signed   By: Aram Candela M.D.   On: 05/28/2023 20:11     Assessment and Plan:   WPW Wide complex Tachycardia Syncope - DCCV in ER to sinus rhythm - amiodarone gtt running - Dr . Lalla Brothers recommended stopping - will DC   - no further tachycardia - telemetry with different p wave morphologies - will have EP evaluate tomorrow for ablation - echo pending    Risk Assessment/Risk Scores:       Code Status: Full Code  Severity of Illness: The appropriate patient status for this patient is INPATIENT. Inpatient status is judged to be reasonable and necessary in order to provide the required intensity of service to ensure the patient's safety. The patient's presenting symptoms, physical exam findings, and initial radiographic and laboratory data in the context of their chronic comorbidities is felt to place them at high risk for further clinical deterioration. Furthermore, it is not anticipated that the patient will be medically stable for discharge from the hospital within 2 midnights of admission.   * I certify that at the point of admission it is my clinical judgment that the patient will require inpatient hospital care spanning beyond 2 midnights from the point of admission due to high intensity of service, high risk for further deterioration and high frequency of surveillance required.*   For questions or updates, please contact [facility] Please consult [email or web address] for contact info under     Signed, Marcelino Duster, [place]  05/29/2023 5:04 PM    Attending Note:   The patient was seen and examined.  Agree with assessment and plan as noted above.  Changes made to the above note  as needed.  Patient seen and independently examined with Bettina Gavia, PA .   We discussed all aspects of the encounter. I agree with the assessment  and plan as stated above.     WPW:   Bobbie presents after having multiple episodes of syncope today.  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  This is not unusual for him but he did present with hypokalemia.  Magnesium level has been ordered.  He is known about the WPW for years.  I suspect that he will need to have this ablated this admission.  He has been maintained on amio since presenting yesterday .  I have discussed with Dr. Lalla Brothers who advised that we DC the amio in order to see what his underlying rhythm.  He will be seen by EP tomorrow     I have spent a total of 40 minutes with patient reviewing hospital  notes , telemetry, EKGs, labs and examining patient as well as establishing an assessment and plan that was discussed with the patient.  > 50% of time was spent in direct patient care.    Vesta Mixer, Montez Hageman., MD, [facility] 05/29/2023, 5:21 PM [address]  [address] Office [phone] Pager [phone]

## 2023-05-30 ENCOUNTER — Inpatient Hospital Stay (HOSPITAL_COMMUNITY): Payer: 59

## 2023-05-30 DIAGNOSIS — R55 Syncope and collapse: Secondary | ICD-10-CM

## 2023-05-30 LAB — BASIC METABOLIC PANEL
Anion gap: 11 (ref 5–15)
BUN: 17 mg/dL (ref 6–20)
CO2: 24 mmol/L (ref 22–32)
Calcium: 9.2 mg/dL (ref 8.9–10.3)
Chloride: 105 mmol/L (ref 98–111)
Creatinine, Ser: 1.22 mg/dL (ref 0.61–1.24)
GFR, Estimated: >60 mL/min (ref >60)
Glucose, Bld: 100 mg/dL — ABNORMAL HIGH (ref 70–99)
Potassium: 3.9 mmol/L (ref 3.5–5.1)
Sodium: 140 mmol/L (ref 135–145)

## 2023-05-30 LAB — GLUCOSE, CAPILLARY: Glucose-Capillary: 76 mg/dL (ref 70–99)

## 2023-05-30 LAB — ECHOCARDIOGRAM COMPLETE
Area-P 1/2: 3.1 cm2
Calc EF: 44.9 %
Est EF: 50
Height: 73 in
S' Lateral: 4.3 cm
Single Plane A2C EF: 46.3 %
Single Plane A4C EF: 42.4 %
Weight: 2939.2 [oz_av]

## 2023-05-30 LAB — LIPID PANEL
Cholesterol: 200 mg/dL (ref 0–200)
HDL: 44 mg/dL (ref >40)
LDL Cholesterol: 144 mg/dL — ABNORMAL HIGH (ref 0–99)
Total CHOL/HDL Ratio: 4.5 {ratio}
Triglycerides: 59 mg/dL (ref <150)
VLDL: 12 mg/dL (ref 0–40)

## 2023-05-30 LAB — CBC
HCT: 42.4 % (ref 39.0–52.0)
Hemoglobin: 13.9 g/dL (ref 13.0–17.0)
MCH: 29.6 pg (ref 26.0–34.0)
MCHC: 32.8 g/dL (ref 30.0–36.0)
MCV: 90.4 fL (ref 80.0–100.0)
Platelets: 253 10*3/uL (ref 150–400)
RBC: 4.69 MIL/uL (ref 4.22–5.81)
RDW: 13 % (ref 11.5–15.5)
WBC: 4.7 10*3/uL (ref 4.0–10.5)
nRBC: 0 % (ref 0.0–0.2)

## 2023-05-30 NOTE — TOC CM/SW Note (Signed)
Transition of Care [facility]) - Inpatient Brief Assessment   Patient Details  Name: Paul Bean MRN: [id number] Date of Birth: 10/21/1995  Transition of Care [facility]) CM/SW Contact:    Gala Lewandowsky, RN Phone Number: 05/30/2023, 1:40 PM   Clinical Narrative: Patient presented for syncope and tachycardia. Patient has insurance and PCP. EP is following the patient. Case Manager will continue to follow for transition of care needs as the patient progresses.    Transition of Care Asessment: Insurance and Status: Insurance coverage has been reviewed Patient has primary care physician: Yes Prior/Current Home Services: No current home services Social Determinants of Health Reivew: SDOH reviewed no interventions necessary Readmission risk has been reviewed: Yes Transition of care needs: no transition of care needs at this time

## 2023-05-30 NOTE — Progress Notes (Signed)
  Echocardiogram 2D Echocardiogram has been performed.  Paul Bean 05/30/2023, 11:17 AM

## 2023-05-30 NOTE — Consult Note (Addendum)
Cardiology Consultation   Patient ID: Paul Bean MRN: [id number]; DOB: September 21, 1995  Admit date: 05/28/2023 Date of Consult: 05/30/2023  PCP:  Alveria Apley, NP   [facility] Providers Cardiologist:  None  Electrophysiologist:  Lewayne Bunting, MD  {    Patient Profile:   Paul Bean is a 27 y.o. male with a hx of WPW (left lateral pathway) who is being seen 05/30/2023 for the evaluation of recurrent syncope, WCT at the request of Dr. Elease Hashimoto.  History of Present Illness:   Mr. Bewley recently saw Dr. Ladona Ridgel, seems for a pre-op evaluation for hernia surgery, known WPW, "minimally symptomatic", no syncope.  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 Family got to him and was brought to the ER  He has not had syncope before  LABS K+ 3.4 > 3.8 > 3.9 Mag 2.0 BUN/Creat 20/0.92 >> 1.22 HS Trop 4 WBC 6.7 H/H 13/423 Plts 253   No home meds   Past Medical History:  Diagnosis Date   History of chickenpox    History of echocardiogram    a.) TTE 10/21/2015: EF >55%, no RWMAs, ? PFO present, triv TR   Inguinal hernia, right 03/2023   Migraine    PSVT (paroxysmal supraventricular tachycardia) (HCC)    WPW (Wolff-Parkinson-White syndrome)     Past Surgical History:  Procedure Laterality Date   INSERTION OF MESH Right 04/13/2023    Procedure: INSERTION OF MESH;  Surgeon: [name], MD;  Location: [facility] ORS;  Service: General;  Laterality: Right;   NO PAST SURGERIES       Home Medications:  Prior to Admission medications   Not on File    Inpatient Medications: Scheduled Meds:  enoxaparin (LOVENOX) injection  40 mg Subcutaneous Q24H   sodium chloride flush  3 mL Intravenous Q12H   Continuous Infusions:  PRN Meds: acetaminophen, ondansetron (ZOFRAN) IV  Allergies:    Allergies  Allergen Reactions   Penicillins Other (See Comments)    As a child (mother was allergic)    Social History:   Social History   Socioeconomic History   Marital status: Single    Spouse name: Not on file   Number of children: 0   Years of education: 12   Highest education level: High school graduate  Occupational History   Occupation: Landscaping  Tobacco Use   Smoking status: Never   Smokeless tobacco: Never  Vaping Use   Vaping status: Never Used  Substance and Sexual Activity   Alcohol use: No   Drug use: No   Sexual activity: Not on file  Other Topics Concern   Not on file  Social History Narrative   Not on file   Social Determinants of Health   Financial Resource Strain: Low Risk  (02/22/2023)   Overall Financial Resource Strain (CARDIA)  Difficulty of Paying Living Expenses: Not hard at all  Food Insecurity: No Food Insecurity (05/29/2023)   Hunger Vital Sign    Worried About Running Out of Food in the Last Year: Never true    Ran Out of Food in the Last Year: Never true  Transportation Needs: No Transportation Needs (05/29/2023)   PRAPARE - Administrator, Civil Service (Medical): No    Lack of Transportation (Non-Medical): No  Physical Activity: Inactive (02/22/2023)   Exercise Vital Sign    Days of Exercise per Week: 0 days    Minutes of Exercise per Session: 0 min  Stress: No Stress Concern Present (02/22/2023)   [employer] of Occupational Health - Occupational Stress  Questionnaire    Feeling of Stress : Not at all  Social Connections: Moderately Isolated (02/22/2023)   Social Connection and Isolation Panel [NHANES]    Frequency of Communication with Friends and Family: More than three times a week    Frequency of Social Gatherings with Friends and Family: Twice a week    Attends Religious Services: 1 to 4 times per year    Active Member of [employer] or Organizations: No    Attends Banker Meetings: Never    Marital Status: Never married  Intimate Partner Violence: Not At Risk (05/29/2023)   Humiliation, Afraid, Rape, and Kick questionnaire    Fear of Current or Ex-Partner: No    Emotionally Abused: No    Physically Abused: No    Sexually Abused: No    Family History:   Family History  Problem Relation Age of Onset   Cancer Maternal Grandfather         Pancreatic   Heart disease Paternal Grandmother    CAD Paternal Grandfather    Prostate cancer Paternal Grandfather      ROS:  Please see the history of present illness.  All other ROS reviewed and negative.     Physical Exam/Data:   Vitals:   05/29/23 1804 05/29/23 2000 05/30/23 0002 05/30/23 0403  BP: 113/67 111/77 102/62 104/63  Pulse: 69 85 60 62  Resp: 18 16 18 20   Temp: 98.3 F (36.8 C) 98 F (36.7 C) 97.8 F (36.6 C) 97.8 F (36.6 C)  TempSrc: Oral Oral Oral Oral  SpO2: 97% 98% 97% 100%  Weight:    83.3 kg  Height:        Intake/Output Summary (Last 24 hours) at 05/30/2023 0746 Last data filed at 05/30/2023 0006 Gross per 24 hour  Intake 165.95 ml  Output 300 ml  Net -134.05 ml      05/30/2023    4:03 AM 05/28/2023    6:14 PM 04/28/2023    2:08 PM  Last 3 Weights  Weight (lbs) 183 lb 11.2 oz 185 lb 187 lb 6.4 oz  Weight (kg) 83.326 kg 83.915 kg 85.004 kg     Body mass index is 24.24 kg/m.  General:  Well nourished, well developed, in no acute distress HEENT: normal Neck: no JVD Vascular: No carotid bruits; Distal pulses 2+ bilaterally Cardiac:  RRR;  no murmurs, gallops or rubs Lungs:  CTA b/l, no wheezing, rhonchi or rales  Abd: soft, nontender Ext: no edema Musculoskeletal:  No deformities, BUE and BLE strength normal and equal Skin: warm and dry  Neuro:  no focal abnormalities noted Psych:  Normal affect   EKG:  The EKG was personally reviewed and demonstrates:    WCT 199bpm, irregular, c/w AFib w/RVR (pre-excited) ST 110bpm,  SR 66, pre-excited  Telemetry:  Telemetry was personally reviewed and demonstrates:   SR 60's  Relevant CV Studies: none  Laboratory Data:  High Sensitivity Troponin:   Recent Labs  Lab 05/28/23 1859  TROPONINIHS 4     Chemistry Recent Labs  Lab 05/28/23 1859 05/29/23 1538 05/29/23 1829 05/30/23 0528  NA 138  --  138 140  K 3.4*  --  3.8 3.9  CL 107  --  106 105  CO2 18*  --  21* 24  GLUCOSE 101*  --  93 100*  BUN 20  --  14 17  CREATININE 0.92 1.29* 1.00 1.22  CALCIUM 9.7  --  8.9 9.2  MG 2.0  --  2.0  --   GFRNONAA >60 >60 >60 >60  ANIONGAP 13  --  11 11    Recent Labs  Lab 05/28/23 1859 05/29/23 1829  PROT 8.0 6.9  ALBUMIN 5.1* 4.2  AST 20 15  ALT 18 16  ALKPHOS 76 66  BILITOT 1.0 0.9   Lipids  Recent Labs  Lab 05/30/23 0528  CHOL 200  TRIG 59  HDL 44  LDLCALC 144*  CHOLHDL 4.5    Hematology Recent Labs  Lab 05/29/23 1538 05/29/23 1829 05/30/23 0528  WBC 5.1 6.0 4.7  RBC 4.91 4.94 4.69  HGB 14.7 15.2 13.9  HCT 43.9 44.3 42.4  MCV 89.4 89.7 90.4  MCH 29.9 30.8 29.6  MCHC 33.5 34.3 32.8  RDW 13.1 12.9 13.0  PLT 266 277 253   Thyroid  Recent Labs  Lab 05/29/23 1829  TSH 1.238  FREET4 1.07    BNPNo results for input(s): "BNP", "PROBNP" in the last 168 hours.  DDimer No results for input(s): "DDIMER" in the last 168 hours.   Radiology/Studies:  X-ray chest PA and lateral  Result Date: 05/29/2023 CLINICAL DATA:  Tachycardia EXAM: CHEST - 2 VIEW COMPARISON:  Chest x-ray 05/28/2023 FINDINGS: The heart size and mediastinal contours are within  normal limits. Both lungs are clear. The visualized skeletal structures are unremarkable. IMPRESSION: No active cardiopulmonary disease. Electronically Signed   By: Darliss Cheney M.D.   On: 05/29/2023 19:14   DG Chest Port 1 View  Result Date: 05/28/2023 CLINICAL DATA:  Syncope. EXAM: PORTABLE CHEST 1 VIEW COMPARISON:  March 06, 2016 FINDINGS: Limited study secondary to patient positioning and an overlying defibrillator pad. The heart size and mediastinal contours are within normal limits. Both lungs are clear. The visualized skeletal structures are unremarkable. IMPRESSION: No active disease. Electronically Signed   By: Aram Candela M.D.   On: 05/28/2023 20:11     Assessment and Plan:   AFib w/RVR WPW Syncope   He will need EPS/ablation Working on anesthesia for tomorrow Dr. Ladona Ridgel has seen and discussed with the patient, rational for the procedure, the procedure, risks/benefits, he is agreeable   Risk Assessment/Risk Scores:     For questions or updates, please contact [facility] Please consult [email or web address] for contact info under    Signed, Sheilah Pigeon, PA-C  05/30/2023 7:46 AM;  EP Attending  Patient seen and examined. He is well know to me. He has had recurrent syncope and found to have atrial fib with manifest ventricular pre-excitation with closest coupled RR interval of 200 ms.! He had never had syncope or much in the way of palpitations. He was cardioverted out of atrial fib, urgently as he presented with hypotension. He feels better.  On exam his a pleasant well appearing young  man, NAD. Lungs are clear and CV with a RRR. Tele with NSR and manifest WPW. A/P Pre-excited atrial fib - he has a life threatening AP and has had syncope. I have discussed the indications/risks/benefits of EP study and ablation and we will try to perform tomorrow.  Sharlot Gowda Iyani Dresner,MD

## 2023-05-30 NOTE — Progress Notes (Signed)
Mobility Specialist Progress Note:   05/30/23 1400  Mobility  Activity Ambulated independently in hallway  Level of Assistance Independent  Assistive Device None  Distance Ambulated (ft) 400 ft  Activity Response Tolerated well  Mobility Referral Yes  $Mobility charge 1 Mobility  Mobility Specialist Start Time (ACUTE ONLY) 1409  Mobility Specialist Stop Time (ACUTE ONLY) 1416  Mobility Specialist Time Calculation (min) (ACUTE ONLY) 7 min    Pre Mobility: 95 HR Post Mobility:  70 HR  Received pt in chair having no complaints and agreeable to mobility. Pt was asymptomatic throughout ambulation and returned to room w/o fault. Denied any SOB or dizziness. Left in chair w/ call bell in reach and all needs met.   D'Vante Earlene Plater Mobility Specialist Please contact via Special educational needs teacher or Rehab office at [phone]

## 2023-05-31 ENCOUNTER — Inpatient Hospital Stay (HOSPITAL_COMMUNITY): Payer: 59 | Admitting: Anesthesiology

## 2023-05-31 ENCOUNTER — Inpatient Hospital Stay (HOSPITAL_COMMUNITY): Payer: Self-pay | Admitting: Anesthesiology

## 2023-05-31 ENCOUNTER — Encounter (HOSPITAL_COMMUNITY)
Admission: EM | Disposition: A | Discharge status: Home / Self Care | Payer: Self-pay | Source: Home / Self Care | Attending: Cardiovascular Disease

## 2023-05-31 DIAGNOSIS — I456 Pre-excitation syndrome: Secondary | ICD-10-CM

## 2023-05-31 DIAGNOSIS — I471 Supraventricular tachycardia, unspecified: Secondary | ICD-10-CM

## 2023-05-31 HISTORY — PX: SVT ABLATION: EP1225

## 2023-05-31 LAB — POCT ACTIVATED CLOTTING TIME
Activated Clotting Time: 250 s
Activated Clotting Time: 311 s

## 2023-05-31 SURGERY — SVT ABLATION
Anesthesia: General

## 2023-05-31 MED ORDER — HEPARIN SODIUM (PORCINE) 1000 UNIT/ML IJ SOLN
INTRAMUSCULAR | Status: DC | PRN
  Administered 2023-05-31: 1000 [IU] via INTRAVENOUS

## 2023-05-31 MED ORDER — FENTANYL CITRATE (PF) 250 MCG/5ML IJ SOLN
INTRAMUSCULAR | Status: DC | PRN
  Administered 2023-05-31: 100 ug via INTRAVENOUS

## 2023-05-31 MED ORDER — SODIUM CHLORIDE 0.9 % IV SOLN
INTRAVENOUS | Status: DC | PRN
Start: 2023-05-31 — End: 2023-05-31

## 2023-05-31 MED ORDER — ROCURONIUM BROMIDE 10 MG/ML (PF) SYRINGE
PREFILLED_SYRINGE | INTRAVENOUS | Status: DC | PRN
  Administered 2023-05-31: 10 mg via INTRAVENOUS
  Administered 2023-05-31: 30 mg via INTRAVENOUS
  Administered 2023-05-31: 60 mg via INTRAVENOUS
  Administered 2023-05-31: 30 mg via INTRAVENOUS

## 2023-05-31 MED ORDER — SODIUM CHLORIDE 0.9% FLUSH
3.0000 mL | Freq: Two times a day (BID) | INTRAVENOUS | Status: DC
  Administered 2023-06-01: 3 mL via INTRAVENOUS

## 2023-05-31 MED ORDER — SODIUM CHLORIDE 0.9% FLUSH: 3.0000 mL | INTRAVENOUS | Status: DC | PRN

## 2023-05-31 MED ORDER — ISOPROTERENOL HCL 0.2 MG/ML IJ SOLN
INTRAVENOUS | Status: DC | PRN
  Administered 2023-05-31: 2 ug/min via INTRAVENOUS

## 2023-05-31 MED ORDER — LIDOCAINE 2% (20 MG/ML) 5 ML SYRINGE
INTRAMUSCULAR | Status: DC | PRN
  Administered 2023-05-31: 100 mg via INTRAVENOUS

## 2023-05-31 MED ORDER — HEPARIN SODIUM (PORCINE) 1000 UNIT/ML IJ SOLN
INTRAMUSCULAR | Status: AC
  Filled 2023-05-31: qty 10

## 2023-05-31 MED ORDER — FENTANYL CITRATE (PF) 100 MCG/2ML IJ SOLN
INTRAMUSCULAR | Status: AC
  Filled 2023-05-31: qty 2

## 2023-05-31 MED ORDER — SODIUM CHLORIDE 0.9 % IV SOLN: 250.0000 mL | INTRAVENOUS | Status: DC | PRN

## 2023-05-31 MED ORDER — APIXABAN 5 MG PO TABS
5.0000 mg | ORAL_TABLET | Freq: Two times a day (BID) | ORAL | Status: DC
  Administered 2023-05-31 – 2023-06-01 (×2): 5 mg via ORAL
  Filled 2023-05-31 (×2): qty 1

## 2023-05-31 MED ORDER — PHENYLEPHRINE HCL-NACL 20-0.9 MG/250ML-% IV SOLN
INTRAVENOUS | Status: DC | PRN
  Administered 2023-05-31 (×2): 30 ug/min via INTRAVENOUS

## 2023-05-31 MED ORDER — HEPARIN (PORCINE) IN NACL 2000-0.9 UNIT/L-% IV SOLN
INTRAVENOUS | Status: DC | PRN
  Administered 2023-05-31: 1000 mL

## 2023-05-31 MED ORDER — DEXAMETHASONE SODIUM PHOSPHATE 10 MG/ML IJ SOLN
INTRAMUSCULAR | Status: DC | PRN
  Administered 2023-05-31: 10 mg via INTRAVENOUS

## 2023-05-31 MED ORDER — ISOPROTERENOL HCL 0.2 MG/ML IJ SOLN
INTRAMUSCULAR | Status: AC
  Filled 2023-05-31: qty 5

## 2023-05-31 MED ORDER — HEPARIN SODIUM (PORCINE) 1000 UNIT/ML IJ SOLN
INTRAMUSCULAR | Status: DC | PRN
Start: 2023-05-31 — End: 2023-05-31
  Administered 2023-05-31: 10000 [IU] via INTRAVENOUS
  Administered 2023-05-31: 4000 [IU] via INTRAVENOUS

## 2023-05-31 MED ORDER — DEXMEDETOMIDINE HCL IN NACL 80 MCG/20ML IV SOLN
INTRAVENOUS | Status: DC | PRN
Start: 2023-05-31 — End: 2023-05-31
  Administered 2023-05-31 (×2): 4 ug via INTRAVENOUS

## 2023-05-31 MED ORDER — PROTAMINE SULFATE 10 MG/ML IV SOLN
INTRAVENOUS | Status: DC | PRN
Start: 2023-05-31 — End: 2023-05-31
  Administered 2023-05-31: 30 mg via INTRAVENOUS

## 2023-05-31 MED ORDER — PROPOFOL 10 MG/ML IV BOLUS
INTRAVENOUS | Status: DC | PRN
  Administered 2023-05-31: 200 mg via INTRAVENOUS

## 2023-05-31 SURGICAL SUPPLY — 20 items
CATH 8FR REPROCESSED SOUNDSTAR (CATHETERS) ×1 IMPLANT
CATH 8FR SOUNDSTAR REPROCESSED (CATHETERS) IMPLANT
CATH ABLAT QDOT MICRO BI TC DF (CATHETERS) IMPLANT
CATH CRD2 QUAD 6FR REP (CATHETERS) IMPLANT
CATH DECANAV F CURVE (CATHETERS) IMPLANT
CATH JOSEPH QUAD ALLRED 6F REP (CATHETERS) IMPLANT
CATH PIGTAIL STEERABLE D1 8.7 (WIRE) IMPLANT
CLOSURE PERCLOSE PROSTYLE (VASCULAR PRODUCTS) IMPLANT
PACK EP LATEX FREE (CUSTOM PROCEDURE TRAY) ×1
PACK EP LF (CUSTOM PROCEDURE TRAY) ×1 IMPLANT
PAD DEFIB RADIO PHYSIO CONN (PAD) ×1 IMPLANT
PATCH CARTO3 (PAD) IMPLANT
SHEATH INTROD W/O MIN 9FR 25CM (SHEATH) IMPLANT
SHEATH PINNACLE 6F 10CM (SHEATH) IMPLANT
SHEATH PINNACLE 7F 10CM (SHEATH) IMPLANT
SHEATH PINNACLE 8F 10CM (SHEATH) IMPLANT
SHEATH PINNACLE 9F 10CM (SHEATH) IMPLANT
SHEATH PROBE COVER 6X72 (BAG) IMPLANT
TUBING SMART ABLATE COOLFLOW (TUBING) IMPLANT
WIRE HI TORQ VERSACORE-J 145CM (WIRE) IMPLANT

## 2023-05-31 NOTE — Anesthesia Procedure Notes (Signed)
Procedure Name: Intubation Date/Time: 05/31/2023 2:21 PM  Performed by: Orlin Hilding, CRNAPre-anesthesia Checklist: Patient identified, Emergency Drugs available, Suction available, Patient being monitored and Timeout performed Patient Re-evaluated:Patient Re-evaluated prior to induction Oxygen Delivery Method: Circle system utilized Preoxygenation: Pre-oxygenation with 100% oxygen Induction Type: IV induction Ventilation: Mask ventilation without difficulty Laryngoscope Size: Mac and 4 Grade View: Grade I Tube type: Oral Rae Tube size: 7.5 mm Number of attempts: 1 Placement Confirmation: ETT inserted through vocal cords under direct vision, positive ETCO2 and breath sounds checked- equal and bilateral Secured at: 21 cm Tube secured with: Tape Dental Injury: Teeth and Oropharynx as per pre-operative assessment

## 2023-05-31 NOTE — Anesthesia Preprocedure Evaluation (Addendum)
Anesthesia Evaluation  Patient identified by MRN, date of birth, ID band Patient awake    Reviewed: Allergy & Precautions, NPO status , Patient's Chart, lab work & pertinent test results  History of Anesthesia Complications Negative for: history of anesthetic complications  Airway Mallampati: II  TM Distance: >3 FB Neck ROM: Full    Dental no notable dental hx.    Pulmonary neg pulmonary ROS   Pulmonary exam normal        Cardiovascular Normal cardiovascular exam+ dysrhythmias Supra Ventricular Tachycardia   WPW   Neuro/Psych  Headaches    GI/Hepatic negative GI ROS, Neg liver ROS,,,  Endo/Other  negative endocrine ROS    Renal/GU negative Renal ROS     Musculoskeletal negative musculoskeletal ROS (+)    Abdominal   Peds  Hematology negative hematology ROS (+)   Anesthesia Other Findings Day of surgery medications reviewed with patient.  Reproductive/Obstetrics                             Anesthesia Physical Anesthesia Plan  ASA: 3  Anesthesia Plan: MAC   Post-op Pain Management: Minimal or no pain anticipated   Induction:   PONV Risk Score and Plan: 1 and Treatment may vary due to age or medical condition and Propofol infusion  Airway Management Planned: Natural Airway and Simple Face Mask  Additional Equipment: None  Intra-op Plan:   Post-operative Plan:   Informed Consent: I have reviewed the patients History and Physical, chart, labs and discussed the procedure including the risks, benefits and alternatives for the proposed anesthesia with the patient or authorized representative who has indicated his/her understanding and acceptance.     Dental advisory given  Plan Discussed with: CRNA  Anesthesia Plan Comments:        Anesthesia Quick Evaluation

## 2023-05-31 NOTE — Progress Notes (Signed)
Rounding Note    Patient Name: Paul Bean Date of Encounter: 05/31/2023  [facility] Cardiologist: Dr. Ladona Ridgel  Subjective   Feels well, NAEO  Inpatient Medications    Scheduled Meds:  enoxaparin (LOVENOX) injection  40 mg Subcutaneous Q24H   fentaNYL       sodium chloride flush  3 mL Intravenous Q12H   Continuous Infusions:  PRN Meds: acetaminophen, fentaNYL, ondansetron (ZOFRAN) IV   Vital Signs    Vitals:   05/30/23 1232 05/30/23 1600 05/30/23 1937 05/31/23 0422  BP: 112/69 117/74 111/69 101/66  Pulse: 65  80 (!) 56  Resp: 18 18 18 18   Temp: 98.2 F (36.8 C) 97.8 F (36.6 C) 97.7 F (36.5 C) (!) 97.5 F (36.4 C)  TempSrc: Oral Oral Oral Oral  SpO2: 97%  97% 97%  Weight:    83.8 kg  Height:        Intake/Output Summary (Last 24 hours) at 05/31/2023 0838 Last data filed at 05/30/2023 1000 Gross per 24 hour  Intake 363 ml  Output --  Net 363 ml      05/31/2023    4:22 AM 05/30/2023    4:03 AM 05/28/2023    6:14 PM  Last 3 Weights  Weight (lbs) 184 lb 12.8 oz 183 lb 11.2 oz 185 lb  Weight (kg) 83.825 kg 83.326 kg 83.915 kg      Telemetry    SR 50s-70's, no AFib - Personally Reviewed  ECG    No new EKGs - Personally Reviewed  Physical Exam   GEN: No acute distress.   Neck: No JVD Cardiac: RRR, no murmurs, rubs, or gallops.  Respiratory: Clear to auscultation bilaterally. GI: Soft, nontender, non-distended  MS: No edema; No deformity. Neuro:  Nonfocal  Psych: Normal affect   Labs    High Sensitivity Troponin:   Recent Labs  Lab 05/28/23 1859  TROPONINIHS 4     Chemistry Recent Labs  Lab 05/28/23 1859 05/29/23 1538 05/29/23 1829 05/30/23 0528  NA 138  --  138 140  K 3.4*  --  3.8 3.9  CL 107  --  106 105  CO2 18*  --  21* 24  GLUCOSE 101*  --  93 100*  BUN 20  --  14 17  CREATININE 0.92 1.29* 1.00 1.22  CALCIUM 9.7  --  8.9 9.2  MG 2.0  --  2.0  --   PROT 8.0  --  6.9  --   ALBUMIN 5.1*  --  4.2  --    AST 20  --  15  --   ALT 18  --  16  --   ALKPHOS 76  --  66  --   BILITOT 1.0  --  0.9  --   GFRNONAA >60 >60 >60 >60  ANIONGAP 13  --  11 11    Lipids  Recent Labs  Lab 05/30/23 0528  CHOL 200  TRIG 59  HDL 44  LDLCALC 144*  CHOLHDL 4.5    Hematology Recent Labs  Lab 05/29/23 1538 05/29/23 1829 05/30/23 0528  WBC 5.1 6.0 4.7  RBC 4.91 4.94 4.69  HGB 14.7 15.2 13.9  HCT 43.9 44.3 42.4  MCV 89.4 89.7 90.4  MCH 29.9 30.8 29.6  MCHC 33.5 34.3 32.8  RDW 13.1 12.9 13.0  PLT 266 277 253   Thyroid  Recent Labs  Lab 05/29/23 1829  TSH 1.238  FREET4 1.07    BNPNo results for input(s): "  BNP", "PROBNP" in the last 168 hours.  DDimer No results for input(s): "DDIMER" in the last 168 hours.   Radiology      Cardiac Studies   05/30/23: TTE 1. Left ventricular ejection fraction, by estimation, is 50%. The left  ventricle has mildly decreased function. The left ventricle demonstrates  global hypokinesis. Left ventricular diastolic parameters were normal.   2. Right ventricular systolic function is normal. The right ventricular  size is normal. Tricuspid regurgitation signal is inadequate for assessing  PA pressure.   3. The mitral valve is normal in structure. Trivial mitral valve  regurgitation. No evidence of mitral stenosis.   4. The aortic valve is tricuspid. Aortic valve regurgitation is not  visualized. No aortic stenosis is present.   5. The inferior vena cava is normal in size with greater than 50%  respiratory variability, suggesting right atrial pressure of 3 mmHg.   Patient Profile     27 y.o. male w.hx of WPW (left lateral pathway), admitted with recurrent syncope, found in rapid AFib > cardioverted in the ER Briefly treated with amio gtt  Assessment & Plan    AFib w/RVR WPW Syncope    planned for EPS, ablation today with Dr. Lalla Brothers He has seen the patient this morning, revisited his diagnosis, procedure, potential risks/benefits, the  patient's father bedside as well. Pt remains agreeable   For questions or updates, please contact [facility] Please consult [email or web address] for contact info under        Signed, Sheilah Pigeon, PA-C  05/31/2023, 8:38 AM

## 2023-05-31 NOTE — Transfer of Care (Signed)
Immediate Anesthesia Transfer of Care Note  Patient: Nonie Hoyer  Procedure(s) Performed: SVT ABLATION  Patient Location: PACU  Anesthesia Type:General  Level of Consciousness: awake, oriented, and patient cooperative  Airway & Oxygen Therapy: Patient Spontanous Breathing  Post-op Assessment: Report given to RN and Post -op Vital signs reviewed and stable  Post vital signs: Reviewed and stable  Last Vitals:  Vitals Value Taken Time  BP 110/74 05/31/23 1724  Temp    Pulse 90 05/31/23 1728  Resp 24 05/31/23 1728  SpO2 100 % 05/31/23 1728  Vitals shown include unfiled device data.  Last Pain:  Vitals:   05/31/23 1415  TempSrc:   PainSc: 0-No pain      Patients Stated Pain Goal: 0 (05/31/23 0800)  Complications: No notable events documented.

## 2023-06-01 ENCOUNTER — Other Ambulatory Visit (HOSPITAL_COMMUNITY): Payer: Self-pay

## 2023-06-01 ENCOUNTER — Encounter (HOSPITAL_COMMUNITY): Payer: Self-pay | Admitting: Cardiology

## 2023-06-01 LAB — GLUCOSE, CAPILLARY: Glucose-Capillary: 99 mg/dL (ref 70–99)

## 2023-06-01 MED ORDER — ORAL CARE MOUTH RINSE: 15.0000 mL | OROMUCOSAL | Status: DC | PRN

## 2023-06-01 MED ORDER — APIXABAN 5 MG PO TABS
5.0000 mg | ORAL_TABLET | Freq: Two times a day (BID) | ORAL | 0 refills | Status: DC
  Filled 2023-06-01: qty 60, 30d supply, fill #0

## 2023-06-01 NOTE — Anesthesia Postprocedure Evaluation (Signed)
Anesthesia Post Note  Patient: Paul Bean  Procedure(s) Performed: SVT ABLATION     Patient location during evaluation: PACU Anesthesia Type: General Level of consciousness: awake and alert Pain management: pain level controlled Vital Signs Assessment: post-procedure vital signs reviewed and stable Respiratory status: spontaneous breathing, nonlabored ventilation, respiratory function stable and patient connected to nasal cannula oxygen Cardiovascular status: blood pressure returned to baseline and stable Postop Assessment: no apparent nausea or vomiting Anesthetic complications: no   No notable events documented.          Daronte Shostak S

## 2023-06-01 NOTE — Discharge Summary (Signed)
DISCHARGE SUMMARY    Patient ID: Paul Bean,  MRN: [id number], DOB/AGE: 10/24/1995 27 y.o.  Admit date: 05/28/2023 Discharge date: 06/01/2023  Primary Care Physician: Alveria Apley, NP Electrophysiologist: Dr. Ladona Ridgel  Primary Discharge Diagnosis:  Syncope WPW  Secondary Discharge Diagnosis:  none  Allergies  Allergen Reactions   Penicillins Other (See Comments)    As a child (mother was allergic)     Procedures This Admission:  05/31/23: EPS/ablation  CONCLUSIONS:  1. Sinus rhythm with preexcitation upon presentation.  2.  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 Successful radiofrequency ablation of a high risk left sided posteroseptal accessory pathway 4. No inducible arrhythmias following ablation.  5. No early apparent complications.  6.  Eliquis 5 mg by mouth twice daily for 30 days   Brief HPI: Paul Bean is a 27 y.o. male was admitted after a syncopal event arrived in rapidly conducting Afib with his known WPW  Hospital Course:  The patient was briefly treated with amiodarone though ultimately externally cardioverted.  Labs were largely unremarkable with mild hypokalemia noted.  He was admitted and recommended to undergo EPS/ablation of his accessory pathway.  He underwent the procedure yesterday without complication,  He was monitored on telemetry throughout his stay without recurrent arrhythmia, EKG today is without pre-excitation.  Groin sites are stable without bleeding, hematoma.  Activity restrictions were reviewed with the patient.  He was advised for the next month given OAC to avoid high risk activities.  The patient feels well, denies any CP/SOB.  He was examined by Dr. Lalla Brothers and considered stable for discharge to home.   Eliquis 5mg  BID for 1 month (sent to [facility]) EP follow up is in progress   Physical Exam: Vitals:   05/31/23 2010 06/01/23 0059 06/01/23 0504  06/01/23 0751  BP:  94/60 (!) 97/58 107/67  Pulse: 71 81 81 72  Resp:    16  Temp:  98 F (36.7 C) 98 F (36.7 C) 97.7 F (36.5 C)  TempSrc:  Oral Oral Oral  SpO2: 100% 98% 99% 100%  Weight:   83.4 kg   Height:        GEN- The patient is well appearing, alert and oriented x 3 today.   HEENT: normocephalic, atraumatic; sclera clear, conjunctiva pink; hearing intact; oropharynx clear; neck supple, no JVP Lungs- CTA b/l, normal work of breathing.  No wheezes, rales, rhonchi Heart- RRR no murmurs, rubs or gallops, PMI not laterally displaced GI- soft, non-tender, non-distended Extremities- no clubbing, cyanosis, or edema Groins sites are stable MS- no significant deformity or atrophy Skin- warm and dry, no rash or lesion Psych- euthymic mood, full affect Neuro- no gross deficits   Labs:   Lab Results  Component Value Date   WBC 4.7 05/30/2023   HGB 13.9 05/30/2023   HCT 42.4 05/30/2023   MCV 90.4 05/30/2023   PLT 253 05/30/2023    Recent Labs  Lab 05/29/23 1829 05/30/23 0528  NA 138 140  K 3.8 3.9  CL 106 105  CO2 21* 24  BUN 14 17  CREATININE 1.00 1.22  CALCIUM 8.9 9.2  PROT 6.9  --   BILITOT 0.9  --   ALKPHOS 66  --   ALT 16  --   AST 15  --   GLUCOSE 93 100*    Discharge Medications:  Allergies as of 06/01/2023       Reactions  Penicillins Other (See Comments)   As a child (mother was allergic)        Medication List     TAKE these medications    apixaban 5 MG Tabs tablet Commonly known as: ELIQUIS Take 1 tablet (5 mg total) by mouth 2 (two) times daily.        Disposition: home Discharge Instructions     Diet - low sodium heart healthy   Complete by: As directed    Increase activity slowly   Complete by: As directed         Duration of Discharge Encounter: Greater than 30 minutes including physician time.  Norma Fredrickson, PA-C 06/01/2023 9:36 AM

## 2023-06-01 NOTE — Progress Notes (Signed)
DC instructions reviewed with pt and father.  Written info provided and reviewed MV:HQIONGE, VT, ablation, and femoral site care.  Pt given Eliquis $10 copay card and will stop at [facility] at discharge.  Pt discharged from unit via wheelchair at approx 0930

## 2023-06-01 NOTE — Discharge Instructions (Signed)

## 2023-06-01 NOTE — Plan of Care (Signed)
  Problem: Health Behavior/Discharge Planning: Goal: Ability to manage health-related needs will improve Outcome: Progressing   Problem: Clinical Measurements: Goal: Will remain free from infection Outcome: Progressing Goal: Respiratory complications will improve Outcome: Progressing Goal: Cardiovascular complication will be avoided Outcome: Progressing   Problem: Safety: Goal: Ability to remain free from injury will improve Outcome: Progressing   Problem: Skin Integrity: Goal: Risk for impaired skin integrity will decrease Outcome: Progressing   Problem: Cardiac: Goal: Ability to maintain adequate cardiovascular perfusion will improve Outcome: Progressing Goal: Vascular access site(s) Level 0-1 will be maintained Outcome: Progressing   Problem: Health Behavior/ Discharge Planning: Goal: Ability to safely manage health related needs after discharge Outcome: Progressing

## 2023-06-02 ENCOUNTER — Telehealth: Payer: Self-pay

## 2023-06-02 NOTE — Transitions of Care (Post Inpatient/ED Visit) (Addendum)
06/02/2023  Name: Paul Bean MRN: [id number] DOB: [birth year]  Today's TOC FU Call Status: Today's TOC FU Call Status:: Successful TOC FU Call Completed TOC FU Call Complete Date: 06/02/23 Patient's Name and Date of Birth confirmed.  Transition Care Management Follow-up Telephone Call Date of Discharge: 06/01/23 Discharge Facility: [facility]) Type of Discharge: Inpatient Admission Primary Inpatient Discharge Diagnosis:: "polymorphic ventricular tachycardia" How have you been since you were released from the hospital?: Better (Pt states he rested well last night-no pain-has been up walking but still taking it easy-appetitie good, BM today, states surg site without s/s of infection) Any questions or concerns?: No  Items Reviewed: Did you receive and understand the discharge instructions provided?: Yes Medications obtained,verified, and reconciled?: Yes (Medications Reviewed) Any new allergies since your discharge?: No Dietary orders reviewed?: Yes Type of Diet Ordered:: low salt/heart healthy Do you have support at home?: Yes People in Home: parent(s) Name of Support/Comfort Primary Source: mo and dad  Medications Reviewed Today: Medications Reviewed Today     Reviewed by Charlyn Minerva, RN (Registered Nurse) on 06/02/23 at 1142  Med List Status: <None>   Medication Order Taking? Sig Documenting Provider Last Dose Status Informant  apixaban (ELIQUIS) 5 MG TABS tablet [id number] Yes Take 1 tablet (5 mg total) by mouth 2 (two) times daily. Sheilah Pigeon, PA-C Taking Active             Home Care and Equipment/Supplies: Were Home Health Services Ordered?: NA Any new equipment or medical supplies ordered?: NA  Functional Questionnaire: Do you need assistance with bathing/showering or dressing?: No Do you need assistance with meal preparation?: No Do you need assistance with eating?: No Do you have difficulty maintaining continence: No Do you need  assistance with getting out of bed/getting out of a chair/moving?: No Do you have difficulty managing or taking your medications?: No  Follow up appointments reviewed: PCP Follow-up appointment confirmed?: Yes Date of PCP follow-up appointment?: 06/14/23 Follow-up Provider: Waymon Budge Specialist [facility] Follow-up appointment confirmed?: Yes Date of Specialist follow-up appointment?: 07/13/23 Follow-Up Specialty Provider:: Dr. Lalla Brothers Do you need transportation to your follow-up appointment?: No (pt confirms he is able to drive himself or will get his dad to take him) Do you understand care options if your condition(s) worsen?: Yes-patient verbalized understanding  SDOH Interventions Today    Flowsheet Row Most Recent Value  SDOH Interventions   Food Insecurity Interventions Intervention Not Indicated  Transportation Interventions Intervention Not Indicated        TOC Interventions Today    Flowsheet Row Most Recent Value  TOC Interventions   TOC Interventions Discussed/Reviewed TOC Interventions Discussed, S/S of infection, Post op wound/incision care, Post discharge activity limitations per provider, Arranged PCP follow up less than 12 days/Care Guide scheduled  [reviewed all activity restrictions as listed on d/c instructions and confirmed pt understood them]      Interventions Today    Flowsheet Row Most Recent Value  Chronic Disease   Chronic disease during today's visit Other  [Wolff Parkinson White Syndrome]  General Interventions   General Interventions Discussed/Reviewed Doctor Visits, General Interventions Discussed, Referral to Nurse  [pt denies needing weekly TOC calls-provided with RN CM contact info and aware to call if needed]  Doctor Visits Discussed/Reviewed Doctor Visits Discussed, PCP, Specialist  PCP/Specialist Visits Compliance with follow-up visit  Education Interventions   Education Provided Provided Education  Provided Verbal Education On  Medication, When to see the doctor, Nutrition  Nutrition Interventions  Nutrition Discussed/Reviewed Nutrition Discussed  Pharmacy Interventions   Pharmacy Dicussed/Reviewed Pharmacy Topics Discussed, Medications and their functions  Safety Interventions   Safety Discussed/Reviewed Safety Discussed        Antionette Fairy, RN,BSN,CCM RN Care Manager Transitions of Care  [facility]  Direct Phone: [phone] Toll Free: [phone] Fax: [phone]

## 2023-06-03 ENCOUNTER — Other Ambulatory Visit (HOSPITAL_COMMUNITY): Payer: Self-pay

## 2023-06-13 NOTE — Progress Notes (Unsigned)
Established Patient Office Visit-Hospital Follow Up   Subjective:  Patient ID: Paul Bean, male    DOB: Jan 19, 1996  Age: 27 y.o. MRN: [id number]  Chief Complaint  Patient presents with   Medical Management of Chronic Issues    Going pretty good since hospital. Legs are feeling back to normal after the surgery.     HPI Patient is here for a hospital follow up. Patient was admitted at [facility] on 05/28/2023 through 06/01/2023 for syncope, tachycardia, and WPW. He had just recovered from hernia surgery on 08/28. Patient initially presented to [facility] ED on 10/12 after several syncopal episodes. 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 Then, he had a total of 4 syncopal episodes. On arrival, he was symptomatic with blurred vision, SHOB, and headache. He was in wide complex tachycardia concerning for polymorphic CT. He was cardioverted to NSR. Then, transferred to [facility] for admission. On 10/15, he had a catheter ablation. He is to continue Eliquis 5mg  BID for 1 month. He has a follow up appointment with cardiology, Dr. Lalla Brothers on 11/27.   He reports he is feeling better. Denies chest pain, SHOB, dizziness, or syncopal episodes. He reports initially his legs were sore and tired, but has improved. He is taking Eliquis 5mg  BID.   ROS See HPI above     Objective:   BP 108/82 (BP Location: Left Arm, Patient Position: Sitting, Cuff Size: Normal)   Pulse 67   Temp 98 F (36.7 C) (Oral)   Wt 189 lb 12.8 oz (86.1 kg)   SpO2 98%   BMI 25.04 kg/m    Physical Exam Vitals reviewed.  Constitutional:      General: He is not in acute distress.    Appearance: Normal appearance. He is not ill-appearing, toxic-appearing or diaphoretic.  HENT:     Head: Normocephalic and atraumatic.  Eyes:     General:        Right eye: No discharge.        Left eye: No discharge.     Conjunctiva/sclera:  Conjunctivae normal.  Cardiovascular:     Rate and Rhythm: Normal rate and regular rhythm.     Heart sounds: Normal heart sounds. No murmur heard.    No friction rub. No gallop.  Pulmonary:     Effort: Pulmonary effort is normal. No respiratory distress.     Breath sounds: Normal breath sounds.  Musculoskeletal:        General: Normal range of motion.  Skin:    General: Skin is warm and dry.  Neurological:     General: No focal deficit present.     Mental Status: He is alert and oriented to person, place, and time. Mental status is at baseline.     Gait: Gait normal.  Psychiatric:        Mood and Affect: Mood normal.        Behavior: Behavior normal.        Thought Content: Thought content normal.        Judgment: Judgment normal.      Assessment & Plan:  Hospital discharge follow-up  1.Review health maintenance:  -Covid vaccine: Declines  -HPV vaccine: Declines  -Influenza vaccine: Unsure, either have a nurse visit in the office or at local pharmacy  2.Hospital follow up. No concerns. 3.If you have chest pain, shortness of breath, palpitations, dizziness, or another syncopal  episode, either call 911 or have someone take you to the closes hospital. 4.Continue on the Eliquis.Follow up with Dr. Lalla Brothers on 11/27.  5.He has a physical scheduled in July 2025.   Zandra Abts, NP

## 2023-06-14 ENCOUNTER — Ambulatory Visit (INDEPENDENT_AMBULATORY_CARE_PROVIDER_SITE_OTHER): Payer: 59 | Admitting: Family Medicine

## 2023-06-14 VITALS — BP 108/82 | HR 67 | Temp 98.0°F | Wt 189.8 lb

## 2023-06-14 DIAGNOSIS — I456 Pre-excitation syndrome: Secondary | ICD-10-CM | POA: Diagnosis not present

## 2023-06-14 DIAGNOSIS — Z09 Encounter for follow-up examination after completed treatment for conditions other than malignant neoplasm: Secondary | ICD-10-CM

## 2023-06-14 NOTE — Patient Instructions (Signed)
-  Hospital follow up. No concerns. -If you have chest pain, shortness of breath, palpitations, dizziness, or another syncopal episode, either call 911 or have someone take you to the closes hospital. -Continue on the Eliquis.Follow up with Dr. Lalla Brothers on 11/27.

## 2023-07-13 ENCOUNTER — Encounter: Payer: Self-pay | Admitting: Cardiology

## 2023-07-13 ENCOUNTER — Ambulatory Visit: Payer: 59 | Attending: Cardiology | Admitting: Cardiology

## 2023-07-13 VITALS — BP 118/70 | HR 81 | Ht 73.0 in | Wt 188.0 lb

## 2023-07-13 DIAGNOSIS — I471 Supraventricular tachycardia, unspecified: Secondary | ICD-10-CM | POA: Diagnosis not present

## 2023-07-13 DIAGNOSIS — I456 Pre-excitation syndrome: Secondary | ICD-10-CM

## 2023-07-13 NOTE — Patient Instructions (Signed)
Medication Instructions:  Your physician recommends that you continue on your current medications as directed. Please refer to the Current Medication list given to you today.  *If you need a refill on your cardiac medications before your next appointment, please call your pharmacy*  Follow-Up: At [facility], you and your health needs are our priority.  As part of our continuing mission to provide you with exceptional heart care, we have created designated Provider Care Teams.  These Care Teams include your primary Cardiologist (physician) and Advanced Practice Providers (APPs -  Physician Assistants and Nurse Practitioners) who all work together to provide you with the care you need, when you need it.  Your next appointment:   As needed with Dr. Lambert 

## 2023-07-13 NOTE — Progress Notes (Signed)
  Electrophysiology Office Follow up Visit Note:    Date:  07/13/2023   ID:  DEMAREON RUANE, DOB 09/18/1995, MRN [id number]  PCP:  Alveria Apley, NP  [facility] Cardiologist:  None  [facility] Electrophysiologist:  Lewayne Bunting, MD    Interval History:     Allah DENIZ TWEEDY is a 27 y.o. male who presents for a follow up visit.   Karston presents for follow-up.  On May 31, 2023 he had an EP study with an ablation of orthodromic reentrant tachycardia that used a left sided posteroseptal pathway.  After the procedure I started him on 30 days of Eliquis given the left-sided ablation.  He saw his primary care physician on June 14, 2023 and was doing well without recurrence of arrhythmia.   Discussed the use of AI scribe software for clinical note transcription with the patient, who gave verbal consent to proceed.  History of Present Illness   The patient, with a history of a cardiac condition that required an ablation procedure, reports occasional skipped beats but denies any sustained fast beats. The patient notes that these skipped beats are more noticeable post-ablation, but he has not experienced any significant cardiac events since the procedure. The patient is no longer taking Eliquis, which was prescribed for one month post-procedure. The patient has returned to work and maintains an active lifestyle.            Past medical, surgical, social and family history were reviewed.  ROS:   Please see the history of present illness.    All other systems reviewed and are negative.  EKGs/Labs/Other Studies Reviewed:    The following studies were reviewed today:  Preop EKG        Postop EKG        Physical Exam:    VS:  BP 118/70 (BP Location: Left Arm, Patient Position: Sitting, Cuff Size: Large)   Pulse 81   Ht 6\' 1"  (1.854 m)   Wt 188 lb (85.3 kg)   SpO2 96%   BMI 24.80 kg/m     Wt Readings from Last 3 Encounters:  07/13/23 188 lb (85.3 kg)   06/14/23 189 lb 12.8 oz (86.1 kg)  06/01/23 183 lb 14.4 oz (83.4 kg)     Physical Exam   GEN: Well appearing male, no distress CARD: RRR, no MRG RESP: No IWOB. CTAB.          ASSESSMENT:    1. WPW (Wolff-Parkinson-White syndrome)   2. PSVT (paroxysmal supraventricular tachycardia) (HCC)    PLAN:    In order of problems listed above:  Assessment and Plan    Post-Ablation for Accessory Pathway No sustained fast beats reported. Occasional skipped beats noted, which is common post-ablation. EKG shows no evidence of the extra pathway, indicating successful ablation. -No further action required at this time. -As needed follow-up appointments.  General Health Maintenance Patient is active and back at work. -Continue current level of activity. -Regular follow-up with primary care physician.      PRN follow up.         Signed, Steffanie Dunn, MD, [facility], [facility] 07/13/2023 11:51 AM    Electrophysiology [facility]

## 2023-10-07 ENCOUNTER — Ambulatory Visit: Payer: 59 | Admitting: Family Medicine

## 2023-10-07 VITALS — BP 136/82 | HR 94 | Temp 98.0°F | Ht 73.0 in | Wt 189.6 lb

## 2023-10-07 DIAGNOSIS — R6889 Other general symptoms and signs: Secondary | ICD-10-CM

## 2023-10-07 DIAGNOSIS — R110 Nausea: Secondary | ICD-10-CM | POA: Diagnosis not present

## 2023-10-07 DIAGNOSIS — R0982 Postnasal drip: Secondary | ICD-10-CM | POA: Diagnosis not present

## 2023-10-07 DIAGNOSIS — J029 Acute pharyngitis, unspecified: Secondary | ICD-10-CM | POA: Diagnosis not present

## 2023-10-07 DIAGNOSIS — B349 Viral infection, unspecified: Secondary | ICD-10-CM | POA: Diagnosis not present

## 2023-10-07 LAB — POCT INFLUENZA A/B
Influenza A, POC: NEGATIVE
Influenza B, POC: NEGATIVE

## 2023-10-07 LAB — POCT RAPID STREP A (OFFICE): Rapid Strep A Screen: NEGATIVE

## 2023-10-07 MED ORDER — FLUTICASONE PROPIONATE 50 MCG/ACT NA SUSP
2.0000 | Freq: Every day | NASAL | 6 refills | Status: AC
Start: 2023-10-07 — End: ?

## 2023-10-07 MED ORDER — ONDANSETRON 4 MG PO TBDP
4.0000 mg | ORAL_TABLET | Freq: Three times a day (TID) | ORAL | 0 refills | Status: AC | PRN
Start: 2023-10-07 — End: ?

## 2023-10-07 NOTE — Patient Instructions (Addendum)
-  Negative for influenza and strep throat.  -Unable to test for covid in office due to availability of test in office. Recommend to test for covid at home. If positive, please call back to the office or send a MyChart message. -Based on symptoms and physical assessment, symptoms correlate with a viral illness. -Recommend to rest, stay hydrated.  -Supportative care. You may continue taking Pepto Bismol, Mucinex, and cough drops.  -Prescribed Zofran as needed for nausea and Flonase daily for post nasal drip.  -If not improved, follow up.

## 2023-10-07 NOTE — Progress Notes (Signed)
Acute Office Visit   Subjective:  Patient ID: Paul Bean, male    DOB: 19-Oct-1995, 28 y.o.   MRN: [id number]  Chief Complaint  Patient presents with   Sore Throat    Started Wednesday night   Cough    Productive some mucus    Diarrhea    Started yesterday evening and this morning     HPI Patient reports he started having sore throat, post nasal drainage, ear pressure, nausea with no vomiting, diarrhea, non-productive cough, generalized weakness/fatigue.  Symptoms started about 36 hours ago.   Denies fever, headache, chest pain, SHOB, body aches, chills,   He has been taking Pepto Bismol, Mucinex, and cough drops with some relief.  ROS See HPI above      Objective:   BP 136/82 (BP Location: Left Arm, Patient Position: Sitting)   Pulse 94   Temp 98 F (36.7 C) (Oral)   Ht 6\' 1"  (1.854 m)   Wt 189 lb 9.6 oz (86 kg)   SpO2 99%   BMI 25.01 kg/m    Physical Exam Vitals reviewed.  Constitutional:      General: He is not in acute distress.    Appearance: Normal appearance. He is well-developed. He is not ill-appearing (Mild), toxic-appearing or diaphoretic.  HENT:     Head: Normocephalic and atraumatic.     Right Ear: Tympanic membrane and ear canal normal. There is no impacted cerumen.     Left Ear: Tympanic membrane and ear canal normal. There is no impacted cerumen.     Nose:     Right Sinus: No maxillary sinus tenderness or frontal sinus tenderness.     Left Sinus: No maxillary sinus tenderness or frontal sinus tenderness.     Mouth/Throat:     Mouth: Mucous membranes are moist.     Pharynx: Oropharynx is clear. Uvula midline. Postnasal drip present. No pharyngeal swelling, oropharyngeal exudate, posterior oropharyngeal erythema or uvula swelling.  Eyes:     General:        Right eye: No discharge.        Left eye: No discharge.     Conjunctiva/sclera: Conjunctivae normal.  Cardiovascular:     Rate and Rhythm: Normal rate and regular rhythm.     Heart  sounds: Normal heart sounds. No murmur heard.    No friction rub. No gallop.  Pulmonary:     Effort: Pulmonary effort is normal. No respiratory distress.     Breath sounds: Normal breath sounds.  Musculoskeletal:        General: Normal range of motion.  Skin:    General: Skin is warm and dry.  Neurological:     General: No focal deficit present.     Mental Status: He is alert and oriented to person, place, and time. Mental status is at baseline.  Psychiatric:        Mood and Affect: Mood normal.        Behavior: Behavior normal.        Thought Content: Thought content normal.        Judgment: Judgment normal.    Results for orders placed or performed in visit on 10/07/23  POCT rapid strep A  Result Value Ref Range   Rapid Strep A Screen Negative Negative  POCT Influenza A/B  Result Value Ref Range   Influenza A, POC Negative Negative   Influenza B, POC Negative Negative      Assessment & Plan:  Viral illness  Sore throat -  POCT rapid strep A  Flu-like symptoms -     POCT Influenza A/B  Nausea -     Ondansetron; Take 1 tablet (4 mg total) by mouth every 8 (eight) hours as needed for nausea or vomiting.  Dispense: 20 tablet; Refill: 0  Post-nasal drip -     Fluticasone Propionate; Place 2 sprays into both nostrils daily.  Dispense: 16 g; Refill: 6  -Negative for influenza and strep throat.  -Unable to test for covid in office due to availability of test in office. Recommend to test for covid at home. If positive, please call back to the office or send a MyChart message. -Based on symptoms and physical assessment, symptoms correlate with a viral illness. Provided general information about viral illness.  -Recommend to rest, stay hydrated.  -Supportative care. You may continue taking Pepto Bismol, Mucinex, and cough drops.  -Prescribed Zofran as needed for nausea and Flonase daily for post nasal drip.  -If not improved, follow up.   Zandra Abts, NP

## 2023-10-24 DIAGNOSIS — Z8249 Family history of ischemic heart disease and other diseases of the circulatory system: Secondary | ICD-10-CM | POA: Diagnosis not present

## 2023-10-24 DIAGNOSIS — Z833 Family history of diabetes mellitus: Secondary | ICD-10-CM | POA: Diagnosis not present

## 2024-01-11 ENCOUNTER — Other Ambulatory Visit: Payer: Self-pay

## 2024-02-23 ENCOUNTER — Encounter: Payer: 59 | Admitting: Family Medicine

## 2024-02-24 ENCOUNTER — Encounter: Payer: 59 | Admitting: Family Medicine

## 2024-09-13 ENCOUNTER — Other Ambulatory Visit (HOSPITAL_BASED_OUTPATIENT_CLINIC_OR_DEPARTMENT_OTHER): Payer: Self-pay
